# Patient Record
Sex: Female | Born: 1945 | Race: White | Hispanic: No | Marital: Married | Smoking: Former smoker
Health system: Southern US, Community
[De-identification: ages and names within clinical notes are randomized; demographics above are authoritative.]

## PROBLEM LIST (undated history)

## (undated) DIAGNOSIS — I10 Essential (primary) hypertension: Secondary | ICD-10-CM

## (undated) DIAGNOSIS — F419 Anxiety disorder, unspecified: Secondary | ICD-10-CM

## (undated) DIAGNOSIS — C801 Malignant (primary) neoplasm, unspecified: Secondary | ICD-10-CM

## (undated) DIAGNOSIS — G4730 Sleep apnea, unspecified: Secondary | ICD-10-CM

## (undated) DIAGNOSIS — I2699 Other pulmonary embolism without acute cor pulmonale: Secondary | ICD-10-CM

## (undated) DIAGNOSIS — E079 Disorder of thyroid, unspecified: Secondary | ICD-10-CM

## (undated) DIAGNOSIS — L404 Guttate psoriasis: Secondary | ICD-10-CM

## (undated) DIAGNOSIS — E039 Hypothyroidism, unspecified: Secondary | ICD-10-CM

## (undated) DIAGNOSIS — K219 Gastro-esophageal reflux disease without esophagitis: Secondary | ICD-10-CM

## (undated) DIAGNOSIS — M1990 Unspecified osteoarthritis, unspecified site: Secondary | ICD-10-CM

---
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
## 1997-09-03 HISTORY — PX: ABDOMINAL HYSTERECTOMY: SHX81

## 2006-06-17 ENCOUNTER — Other Ambulatory Visit: Payer: Self-pay

## 2006-06-17 ENCOUNTER — Emergency Department: Payer: Self-pay | Admitting: Emergency Medicine

## 2007-12-25 IMAGING — CR DG CHEST 1V PORT
1 series · 1 of 1 positions shown · non-contrast
Comparison: none

REASON FOR EXAM: cp
COMMENTS:

PROCEDURE:     DXR - DXR PORTABLE CHEST SINGLE VIEW  - June 18, 2006 [DATE]
RESULT:     AP view of the chest shows the lung fields to be clear. No
pneumonia, pneumothorax or pleural effusion is seen.  Heart size is normal.
The mediastinal and osseous structures show no significant abnormalities.

[view not recorded]
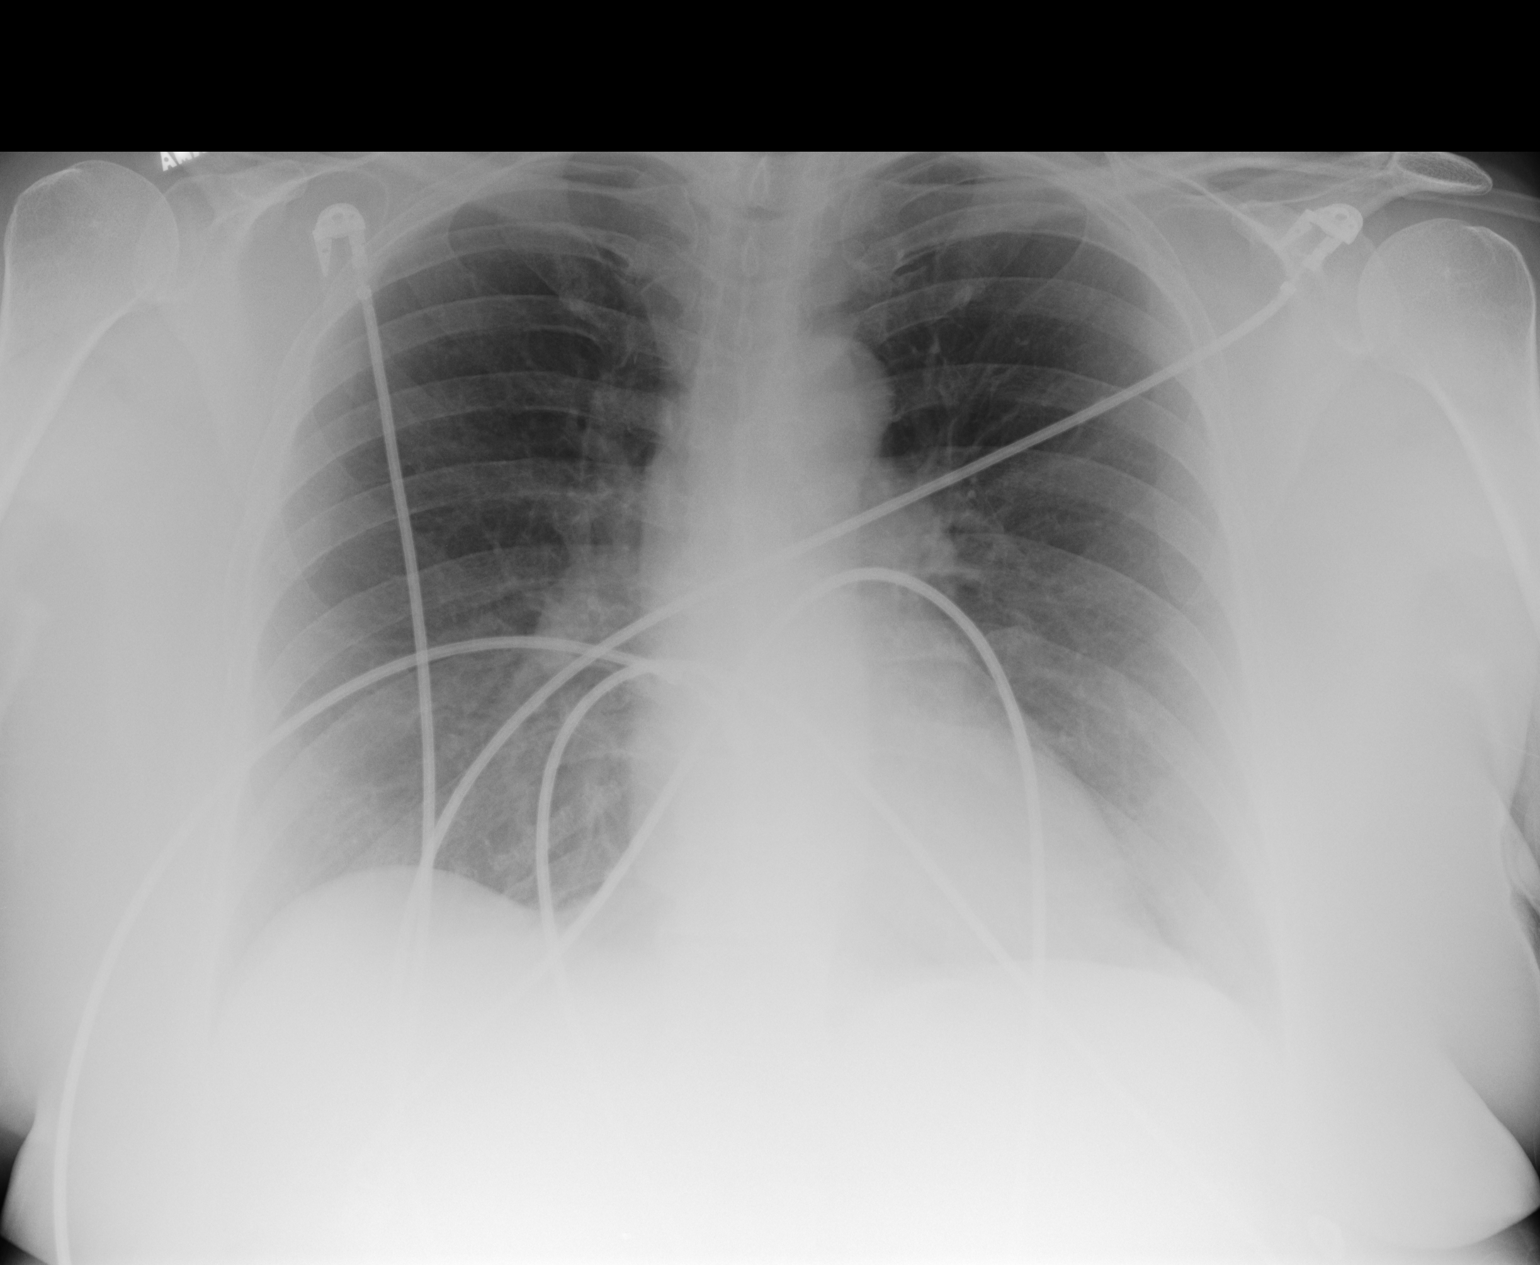

[1 of 1 positions shown; findings below may reference images not displayed]

IMPRESSION: 1)No acute changes are identified.

## 2008-10-05 ENCOUNTER — Ambulatory Visit: Payer: Self-pay

## 2011-09-04 HISTORY — PX: MOHS SURGERY: SUR867

## 2014-06-18 ENCOUNTER — Ambulatory Visit: Payer: Self-pay | Admitting: Internal Medicine

## 2014-09-03 DIAGNOSIS — I2699 Other pulmonary embolism without acute cor pulmonale: Secondary | ICD-10-CM

## 2014-09-03 DIAGNOSIS — E079 Disorder of thyroid, unspecified: Secondary | ICD-10-CM

## 2014-09-03 DIAGNOSIS — L404 Guttate psoriasis: Secondary | ICD-10-CM

## 2014-09-03 HISTORY — DX: Other pulmonary embolism without acute cor pulmonale: I26.99

## 2014-09-03 HISTORY — DX: Guttate psoriasis: L40.4

## 2014-09-03 HISTORY — DX: Disorder of thyroid, unspecified: E07.9

## 2016-05-11 ENCOUNTER — Other Ambulatory Visit: Payer: Self-pay | Admitting: Unknown Physician Specialty

## 2016-05-11 DIAGNOSIS — M1712 Unilateral primary osteoarthritis, left knee: Secondary | ICD-10-CM

## 2016-05-11 DIAGNOSIS — M25562 Pain in left knee: Secondary | ICD-10-CM

## 2016-05-22 ENCOUNTER — Ambulatory Visit: Admission: RE | Admit: 2016-05-22 | Payer: Medicare HMO | Source: Ambulatory Visit

## 2016-10-03 HISTORY — PX: JOINT REPLACEMENT: SHX530

## 2017-06-12 ENCOUNTER — Encounter
Admission: RE | Admit: 2017-06-12 | Discharge: 2017-06-12 | Disposition: A | Discharge status: Home / Self Care | Payer: Medicare HMO | Source: Ambulatory Visit | Attending: Orthopedic Surgery | Admitting: Orthopedic Surgery

## 2017-06-12 DIAGNOSIS — Z01810 Encounter for preprocedural cardiovascular examination: Secondary | ICD-10-CM | POA: Diagnosis present

## 2017-06-12 DIAGNOSIS — F419 Anxiety disorder, unspecified: Secondary | ICD-10-CM | POA: Insufficient documentation

## 2017-06-12 DIAGNOSIS — Z85828 Personal history of other malignant neoplasm of skin: Secondary | ICD-10-CM | POA: Diagnosis not present

## 2017-06-12 DIAGNOSIS — I10 Essential (primary) hypertension: Secondary | ICD-10-CM | POA: Insufficient documentation

## 2017-06-12 DIAGNOSIS — E119 Type 2 diabetes mellitus without complications: Secondary | ICD-10-CM | POA: Insufficient documentation

## 2017-06-12 DIAGNOSIS — Z01812 Encounter for preprocedural laboratory examination: Secondary | ICD-10-CM | POA: Diagnosis present

## 2017-06-12 DIAGNOSIS — E039 Hypothyroidism, unspecified: Secondary | ICD-10-CM | POA: Insufficient documentation

## 2017-06-12 DIAGNOSIS — K219 Gastro-esophageal reflux disease without esophagitis: Secondary | ICD-10-CM | POA: Diagnosis not present

## 2017-06-12 HISTORY — DX: Anxiety disorder, unspecified: F41.9

## 2017-06-12 HISTORY — DX: Other pulmonary embolism without acute cor pulmonale: I26.99

## 2017-06-12 HISTORY — DX: Malignant (primary) neoplasm, unspecified: C80.1

## 2017-06-12 HISTORY — DX: Essential (primary) hypertension: I10

## 2017-06-12 HISTORY — DX: Sleep apnea, unspecified: G47.30

## 2017-06-12 HISTORY — DX: Unspecified osteoarthritis, unspecified site: M19.90

## 2017-06-12 HISTORY — DX: Guttate psoriasis: L40.4

## 2017-06-12 HISTORY — DX: Gastro-esophageal reflux disease without esophagitis: K21.9

## 2017-06-12 HISTORY — DX: Disorder of thyroid, unspecified: E07.9

## 2017-06-12 HISTORY — DX: Hypothyroidism, unspecified: E03.9

## 2017-06-12 LAB — BASIC METABOLIC PANEL
Anion gap: 8 (ref 5–15)
BUN: 24 mg/dL — ABNORMAL HIGH (ref 6–20)
CHLORIDE: 106 mmol/L (ref 101–111)
CO2: 27 mmol/L (ref 22–32)
CREATININE: 0.91 mg/dL (ref 0.44–1.00)
Calcium: 9 mg/dL (ref 8.9–10.3)
Glucose, Bld: 164 mg/dL — ABNORMAL HIGH (ref 65–99)
POTASSIUM: 4.7 mmol/L (ref 3.5–5.1)
SODIUM: 141 mmol/L (ref 135–145)

## 2017-06-12 NOTE — Patient Instructions (Signed)
Your procedure is scheduled on: Wednesday Oct. 17, 2018. Report to Same Day Surgery. To find out your arrival time please call [phone] between 1PM - 3PM on Tuesday Oct. 16, 2018.  Remember: Instructions that are not followed completely may result in serious medical risk, up to and including death, or upon the discretion of your surgeon and anesthesiologist your surgery may need to be rescheduled.     _X__ 1. Do not eat food after midnight the night before your procedure.                 No gum chewing or hard candies. You may drink clear liquids up to 2 hours                 before you are scheduled to arrive for your surgery- DO not drink clear                 liquids within 2 hours of the start of your surgery.                 Clear Liquids include:  water.     _X__ 2.  No Alcohol for 24 hours before or after surgery.   ___ 3.  Do Not Smoke or use e-cigarettes For 24 Hours Prior to Your Surgery.                 Do not use any chewable tobacco products for at least 6 hours prior to                 surgery.  ____  4.  Bring all medications with you on the day of surgery if instructed.   _x___  5.  Notify your doctor if there is any change in your medical condition      (cold, fever, infections).     Do not wear jewelry, make-up, hairpins, clips or nail polish. Do not wear lotions, powders, or perfumes. You may wear deodorant. Do not shave 48 hours prior to surgery. Men may shave face and neck. Do not bring valuables to the hospital.    [facility] is not responsible for any belongings or valuables.  Contacts, dentures or bridgework may not be worn into surgery. Leave your suitcase in the car. After surgery it may be brought to your room. For patients admitted to the hospital, discharge time is determined by your treatment team.   Patients discharged the day of surgery will not be allowed to drive home.   Please read over the following fact sheets  that you were given:   Preparing for Surgery   __x__ Take these medicines the morning of surgery with A SIP OF WATER:    1. omeprazole (PRILOSEC) take an extra dose at bedtime the night prior to surgery and morning of surgery   ____ Fleet Enema (as directed)   _x___ Use CHG Soap as directed  _x___ Use inhalers on the day of surgery  _x___ Stop metformin 2 days prior to surgery, last dose on Sunday.    ____ Take 1/2 of usual insulin dose the night before surgery. No insulin the morning          of surgery.   ____ Stop Coumadin/Plavix/aspirin on does not apply.  __x__ Stop Anti-inflammatories: ibuprofen (ADVIL,MOTRIN), naproxen (NAPROSYN) ,  now, can take Tylenol   ____ Stop supplements:co-enzyme Q-10 until after surgery.    ____ Bring C-Pap to the hospital.

## 2017-06-12 NOTE — Pre-Procedure Instructions (Signed)
ECG 12 Lead1/23/2018 [facility] Name Value Ref Range  EKG Systolic BP  mmHg  EKG Diastolic BP  mmHg  EKG Ventricular Rate 92 BPM  EKG Atrial Rate 92 BPM  EKG P-R Interval 168 ms  EKG QRS Duration 88 ms  EKG Q-T Interval 370 ms  EKG QTC Calculation 457 ms  EKG Calculated P Axis 42 degrees  EKG Calculated R Axis 7 degrees  EKG Calculated T Axis 43 degrees  Result Narrative  NORMAL SINUS RHYTHM NORMAL ECG WHEN COMPARED WITH ECG OF 11-Dec-2015 07:45, MINIMAL CRITERIA FOR ANTERIOR INFARCTARE NO LONGER PRESENT T WAVE INVERSION NO LONGER EVIDENT IN ANTERIOR LEADS Confirmed by SMITHMD, SIDNEY ([id number]) on 09/25/2016 9:55:20 PM

## 2017-06-18 MED ORDER — CEFAZOLIN SODIUM-DEXTROSE 2-4 GM/100ML-% IV SOLN
2.0000 g | INTRAVENOUS | Status: AC
  Administered 2017-06-19: 2 g via INTRAVENOUS

## 2017-06-19 ENCOUNTER — Encounter: Payer: Self-pay | Admitting: Orthopedic Surgery

## 2017-06-19 ENCOUNTER — Ambulatory Visit: Payer: Medicare HMO | Admitting: Anesthesiology

## 2017-06-19 ENCOUNTER — Ambulatory Visit
Admission: RE | Admit: 2017-06-19 | Discharge: 2017-06-19 | Disposition: A | Discharge status: Home / Self Care | Payer: Medicare HMO | Source: Ambulatory Visit | Attending: Orthopedic Surgery | Admitting: Orthopedic Surgery

## 2017-06-19 ENCOUNTER — Encounter
Admission: RE | Disposition: A | Discharge status: Home / Self Care | Payer: Self-pay | Source: Ambulatory Visit | Attending: Orthopedic Surgery

## 2017-06-19 DIAGNOSIS — M24662 Ankylosis, left knee: Secondary | ICD-10-CM | POA: Insufficient documentation

## 2017-06-19 DIAGNOSIS — I10 Essential (primary) hypertension: Secondary | ICD-10-CM | POA: Insufficient documentation

## 2017-06-19 DIAGNOSIS — R42 Dizziness and giddiness: Secondary | ICD-10-CM | POA: Insufficient documentation

## 2017-06-19 DIAGNOSIS — Z79899 Other long term (current) drug therapy: Secondary | ICD-10-CM | POA: Diagnosis not present

## 2017-06-19 DIAGNOSIS — Z807 Family history of other malignant neoplasms of lymphoid, hematopoietic and related tissues: Secondary | ICD-10-CM | POA: Insufficient documentation

## 2017-06-19 DIAGNOSIS — Z833 Family history of diabetes mellitus: Secondary | ICD-10-CM | POA: Insufficient documentation

## 2017-06-19 DIAGNOSIS — Z853 Personal history of malignant neoplasm of breast: Secondary | ICD-10-CM | POA: Diagnosis not present

## 2017-06-19 DIAGNOSIS — Z87891 Personal history of nicotine dependence: Secondary | ICD-10-CM | POA: Diagnosis not present

## 2017-06-19 DIAGNOSIS — E785 Hyperlipidemia, unspecified: Secondary | ICD-10-CM | POA: Insufficient documentation

## 2017-06-19 DIAGNOSIS — Z85828 Personal history of other malignant neoplasm of skin: Secondary | ICD-10-CM | POA: Diagnosis not present

## 2017-06-19 DIAGNOSIS — Z96652 Presence of left artificial knee joint: Secondary | ICD-10-CM | POA: Insufficient documentation

## 2017-06-19 DIAGNOSIS — E119 Type 2 diabetes mellitus without complications: Secondary | ICD-10-CM | POA: Diagnosis not present

## 2017-06-19 DIAGNOSIS — M1990 Unspecified osteoarthritis, unspecified site: Secondary | ICD-10-CM | POA: Insufficient documentation

## 2017-06-19 DIAGNOSIS — K219 Gastro-esophageal reflux disease without esophagitis: Secondary | ICD-10-CM | POA: Diagnosis not present

## 2017-06-19 DIAGNOSIS — Z820 Family history of epilepsy and other diseases of the nervous system: Secondary | ICD-10-CM | POA: Insufficient documentation

## 2017-06-19 DIAGNOSIS — Z90710 Acquired absence of both cervix and uterus: Secondary | ICD-10-CM | POA: Diagnosis not present

## 2017-06-19 DIAGNOSIS — Z7984 Long term (current) use of oral hypoglycemic drugs: Secondary | ICD-10-CM | POA: Insufficient documentation

## 2017-06-19 DIAGNOSIS — Z86711 Personal history of pulmonary embolism: Secondary | ICD-10-CM | POA: Insufficient documentation

## 2017-06-19 DIAGNOSIS — E049 Nontoxic goiter, unspecified: Secondary | ICD-10-CM | POA: Diagnosis not present

## 2017-06-19 DIAGNOSIS — L404 Guttate psoriasis: Secondary | ICD-10-CM | POA: Insufficient documentation

## 2017-06-19 DIAGNOSIS — G4730 Sleep apnea, unspecified: Secondary | ICD-10-CM | POA: Diagnosis not present

## 2017-06-19 DIAGNOSIS — F329 Major depressive disorder, single episode, unspecified: Secondary | ICD-10-CM | POA: Diagnosis not present

## 2017-06-19 DIAGNOSIS — Z8249 Family history of ischemic heart disease and other diseases of the circulatory system: Secondary | ICD-10-CM | POA: Insufficient documentation

## 2017-06-19 HISTORY — PX: KNEE ARTHROSCOPY: SHX127

## 2017-06-19 LAB — GLUCOSE, CAPILLARY
GLUCOSE-CAPILLARY: 109 mg/dL — AB (ref 65–99)
GLUCOSE-CAPILLARY: 128 mg/dL — AB (ref 65–99)

## 2017-06-19 SURGERY — ARTHROSCOPY, KNEE
Anesthesia: General | Site: Knee | Laterality: Left | Wound class: Clean

## 2017-06-19 MED ORDER — MIDAZOLAM HCL 2 MG/2ML IJ SOLN
INTRAMUSCULAR | Status: AC
  Filled 2017-06-19: qty 2

## 2017-06-19 MED ORDER — LIDOCAINE HCL (PF) 2 % IJ SOLN
INTRAMUSCULAR | Status: AC
  Filled 2017-06-19: qty 10

## 2017-06-19 MED ORDER — BUPIVACAINE-EPINEPHRINE (PF) 0.25% -1:200000 IJ SOLN
INTRAMUSCULAR | Status: DC | PRN
  Administered 2017-06-19: 5 mL via PERINEURAL
  Administered 2017-06-19: 25 mL via PERINEURAL

## 2017-06-19 MED ORDER — MIDAZOLAM HCL 2 MG/2ML IJ SOLN
INTRAMUSCULAR | Status: DC | PRN
  Administered 2017-06-19: 2 mg via INTRAVENOUS

## 2017-06-19 MED ORDER — OXYCODONE HCL 5 MG PO TABS
5.0000 mg | ORAL_TABLET | Freq: Once | ORAL | Status: AC | PRN
  Administered 2017-06-19: 5 mg via ORAL

## 2017-06-19 MED ORDER — CEFAZOLIN SODIUM-DEXTROSE 2-4 GM/100ML-% IV SOLN
INTRAVENOUS | Status: AC
  Filled 2017-06-19: qty 100

## 2017-06-19 MED ORDER — ONDANSETRON HCL 4 MG/2ML IJ SOLN
INTRAMUSCULAR | Status: DC | PRN
Start: 2017-06-19 — End: 2017-06-19
  Administered 2017-06-19: 4 mg via INTRAVENOUS

## 2017-06-19 MED ORDER — SEVOFLURANE IN SOLN
RESPIRATORY_TRACT | Status: AC
  Filled 2017-06-19: qty 250

## 2017-06-19 MED ORDER — FENTANYL CITRATE (PF) 100 MCG/2ML IJ SOLN
25.0000 ug | INTRAMUSCULAR | Status: DC | PRN
  Administered 2017-06-19: 50 ug via INTRAVENOUS
  Administered 2017-06-19 (×3): 25 ug via INTRAVENOUS

## 2017-06-19 MED ORDER — FENTANYL CITRATE (PF) 100 MCG/2ML IJ SOLN
INTRAMUSCULAR | Status: DC | PRN
  Administered 2017-06-19 (×2): 50 ug via INTRAVENOUS
  Administered 2017-06-19: 25 ug via INTRAVENOUS
  Administered 2017-06-19: 50 ug via INTRAVENOUS
  Administered 2017-06-19: 25 ug via INTRAVENOUS

## 2017-06-19 MED ORDER — SODIUM CHLORIDE 0.9 % IV SOLN: INTRAVENOUS | Status: DC

## 2017-06-19 MED ORDER — PROPOFOL 10 MG/ML IV BOLUS
INTRAVENOUS | Status: AC
  Filled 2017-06-19: qty 20

## 2017-06-19 MED ORDER — METOCLOPRAMIDE HCL 10 MG PO TABS: 5.0000 mg | ORAL_TABLET | Freq: Three times a day (TID) | ORAL | Status: DC | PRN

## 2017-06-19 MED ORDER — GLYCOPYRROLATE 0.2 MG/ML IJ SOLN
INTRAMUSCULAR | Status: AC
  Filled 2017-06-19: qty 1

## 2017-06-19 MED ORDER — PROPOFOL 10 MG/ML IV BOLUS
INTRAVENOUS | Status: DC | PRN
  Administered 2017-06-19: 180 mg via INTRAVENOUS

## 2017-06-19 MED ORDER — CHLORHEXIDINE GLUCONATE 4 % EX LIQD: 60.0000 mL | Freq: Once | CUTANEOUS | Status: DC

## 2017-06-19 MED ORDER — LIDOCAINE HCL (CARDIAC) 20 MG/ML IV SOLN
INTRAVENOUS | Status: DC | PRN
  Administered 2017-06-19: 100 mg via INTRAVENOUS

## 2017-06-19 MED ORDER — ONDANSETRON HCL 4 MG/2ML IJ SOLN
4.0000 mg | Freq: Four times a day (QID) | INTRAMUSCULAR | Status: DC | PRN
  Administered 2017-06-19: 4 mg via INTRAVENOUS

## 2017-06-19 MED ORDER — OXYCODONE HCL 5 MG PO TABS: 5.0000 mg | ORAL_TABLET | ORAL | Status: DC | PRN

## 2017-06-19 MED ORDER — OXYCODONE HCL 5 MG/5ML PO SOLN: 5.0000 mg | Freq: Once | ORAL | Status: AC | PRN

## 2017-06-19 MED ORDER — SUCCINYLCHOLINE CHLORIDE 200 MG/10ML IV SOSY
PREFILLED_SYRINGE | INTRAVENOUS | Status: DC | PRN
  Administered 2017-06-19: 20 mg via INTRAVENOUS

## 2017-06-19 MED ORDER — ONDANSETRON HCL 4 MG/2ML IJ SOLN
INTRAMUSCULAR | Status: AC
  Filled 2017-06-19: qty 2

## 2017-06-19 MED ORDER — GLYCOPYRROLATE 0.2 MG/ML IJ SOLN
INTRAMUSCULAR | Status: DC | PRN
  Administered 2017-06-19: 0.2 mg via INTRAVENOUS

## 2017-06-19 MED ORDER — BUPIVACAINE-EPINEPHRINE (PF) 0.25% -1:200000 IJ SOLN
INTRAMUSCULAR | Status: AC
  Filled 2017-06-19: qty 30

## 2017-06-19 MED ORDER — ACETAMINOPHEN 10 MG/ML IV SOLN
INTRAVENOUS | Status: AC
  Filled 2017-06-19: qty 100

## 2017-06-19 MED ORDER — METOCLOPRAMIDE HCL 5 MG/ML IJ SOLN: 5.0000 mg | Freq: Three times a day (TID) | INTRAMUSCULAR | Status: DC | PRN

## 2017-06-19 MED ORDER — FENTANYL CITRATE (PF) 100 MCG/2ML IJ SOLN
INTRAMUSCULAR | Status: AC
  Filled 2017-06-19: qty 2

## 2017-06-19 MED ORDER — MORPHINE SULFATE 4 MG/ML IJ SOLN
INTRAMUSCULAR | Status: DC | PRN
  Administered 2017-06-19: 4 mg

## 2017-06-19 MED ORDER — APIXABAN 2.5 MG PO TABS: 2.5000 mg | ORAL_TABLET | Freq: Two times a day (BID) | ORAL | Status: AC

## 2017-06-19 MED ORDER — FENTANYL CITRATE (PF) 100 MCG/2ML IJ SOLN
INTRAMUSCULAR | Status: AC
  Administered 2017-06-19: 25 ug via INTRAVENOUS
  Filled 2017-06-19: qty 2

## 2017-06-19 MED ORDER — SODIUM CHLORIDE 0.9 % IV SOLN
INTRAVENOUS | Status: DC
  Administered 2017-06-19: 14:00:00 via INTRAVENOUS

## 2017-06-19 MED ORDER — OXYCODONE HCL 5 MG PO TABS: 5.0000 mg | ORAL_TABLET | ORAL | 0 refills | Status: DC | PRN

## 2017-06-19 MED ORDER — SODIUM CHLORIDE 0.9 % IJ SOLN
INTRAMUSCULAR | Status: AC
  Filled 2017-06-19: qty 10

## 2017-06-19 MED ORDER — ONDANSETRON HCL 4 MG PO TABS
4.0000 mg | ORAL_TABLET | Freq: Four times a day (QID) | ORAL | Status: DC | PRN
Start: 2017-06-19 — End: 2017-06-19

## 2017-06-19 MED ORDER — LACTATED RINGERS IV SOLN
INTRAVENOUS | Status: DC | PRN
  Administered 2017-06-19: 17:00:00 via INTRAVENOUS

## 2017-06-19 MED ORDER — DEXAMETHASONE SODIUM PHOSPHATE 4 MG/ML IJ SOLN
INTRAMUSCULAR | Status: DC | PRN
  Administered 2017-06-19: 10 mg via INTRAVENOUS

## 2017-06-19 MED ORDER — ACETAMINOPHEN 10 MG/ML IV SOLN
INTRAVENOUS | Status: DC | PRN
  Administered 2017-06-19: 1000 mg via INTRAVENOUS

## 2017-06-19 MED ORDER — OXYCODONE HCL 5 MG PO TABS
ORAL_TABLET | ORAL | Status: AC
  Administered 2017-06-19: 5 mg via ORAL
  Filled 2017-06-19: qty 1

## 2017-06-19 MED ORDER — MIDAZOLAM HCL 2 MG/2ML IJ SOLN
INTRAMUSCULAR | Status: AC
  Administered 2017-06-19: 1 mg via INTRAVENOUS
  Filled 2017-06-19: qty 2

## 2017-06-19 MED ORDER — MORPHINE SULFATE (PF) 4 MG/ML IV SOLN
INTRAVENOUS | Status: AC
  Filled 2017-06-19: qty 1

## 2017-06-19 MED ORDER — MIDAZOLAM HCL 2 MG/2ML IJ SOLN
1.0000 mg | Freq: Once | INTRAMUSCULAR | Status: AC
  Administered 2017-06-19: 1 mg via INTRAVENOUS

## 2017-06-19 MED ORDER — DEXAMETHASONE SODIUM PHOSPHATE 10 MG/ML IJ SOLN
INTRAMUSCULAR | Status: AC
  Filled 2017-06-19: qty 1

## 2017-06-19 SURGICAL SUPPLY — 33 items
ADAPTER IRRIG TUBE 2 SPIKE SOL (ADAPTER) ×4 IMPLANT
BLADE SHAVER 4.5 DBL SERAT CV (CUTTER) IMPLANT
COOLER POLAR GLACIER W/PUMP (MISCELLANEOUS) ×2 IMPLANT
CUFF TOURN 24 STER (MISCELLANEOUS) IMPLANT
CUFF TOURN 30 STER DUAL PORT (MISCELLANEOUS) ×2 IMPLANT
DRSG DERMACEA 8X12 NADH (GAUZE/BANDAGES/DRESSINGS) ×2 IMPLANT
DURAPREP 26ML APPLICATOR (WOUND CARE) ×4 IMPLANT
GAUZE SPONGE 4X4 12PLY STRL (GAUZE/BANDAGES/DRESSINGS) ×2 IMPLANT
GLOVE BIOGEL M STRL SZ7.5 (GLOVE) ×4 IMPLANT
GLOVE BIOGEL PI IND STRL 7.0 (GLOVE) ×2 IMPLANT
GLOVE BIOGEL PI INDICATOR 7.0 (GLOVE) ×2
GLOVE INDICATOR 8.0 STRL GRN (GLOVE) ×2 IMPLANT
GOWN STRL REUS W/ TWL LRG LVL3 (GOWN DISPOSABLE) ×2 IMPLANT
GOWN STRL REUS W/TWL LRG LVL3 (GOWN DISPOSABLE) ×2
IV LACTATED RINGER IRRG 3000ML (IV SOLUTION) ×12
IV LR IRRIG 3000ML ARTHROMATIC (IV SOLUTION) ×12 IMPLANT
KIT RM TURNOVER STRD PROC AR (KITS) ×2 IMPLANT
MANIFOLD NEPTUNE II (INSTRUMENTS) ×2 IMPLANT
PACK ARTHROSCOPY KNEE (MISCELLANEOUS) ×2 IMPLANT
PAD ABD DERMACEA PRESS 5X9 (GAUZE/BANDAGES/DRESSINGS) ×4 IMPLANT
PAD WRAPON POLOR MULTI XL (MISCELLANEOUS) ×1 IMPLANT
SET TUBE SUCT SHAVER OUTFL 24K (TUBING) ×2 IMPLANT
SET TUBE TIP INTRA-ARTICULAR (MISCELLANEOUS) ×2 IMPLANT
SOL PREP PVP 2OZ (MISCELLANEOUS) ×2
SOLUTION PREP PVP 2OZ (MISCELLANEOUS) ×1 IMPLANT
SUT ETHILON 3-0 FS-10 30 BLK (SUTURE) ×2
SUTURE EHLN 3-0 FS-10 30 BLK (SUTURE) ×1 IMPLANT
SYR 5ML 18GX1 1/2 (NEEDLE) ×2 IMPLANT
TUBING ARTHRO INFLOW-ONLY STRL (TUBING) ×2 IMPLANT
WAND HAND CNTRL MULTIVAC 50 (MISCELLANEOUS) ×2 IMPLANT
WRAP KNEE W/COLD PACKS 25.5X14 (SOFTGOODS) IMPLANT
WRAP-ON POLOR PAD MULTI XL (MISCELLANEOUS) ×1
WRAPON POLOR PAD MULTI XL (MISCELLANEOUS) ×1

## 2017-06-19 NOTE — Anesthesia Procedure Notes (Signed)
Procedure Name: LMA Insertion Date/Time: 06/19/2017 3:47 PM Performed by: Rosaria Ferries, Lilyth Lawyer Pre-anesthesia Checklist: Patient identified, Emergency Drugs available, Suction available and Patient being monitored Patient Re-evaluated:Patient Re-evaluated prior to induction Oxygen Delivery Method: Circle system utilized Preoxygenation: Pre-oxygenation with 100% oxygen Induction Type: IV induction LMA Size: 4.0 Number of attempts: 1 Placement Confirmation: positive ETCO2 Tube secured with: Tape Dental Injury: Teeth and Oropharynx as per pre-operative assessment

## 2017-06-19 NOTE — H&P (Signed)
The patient has been re-examined, and the chart reviewed, and there have been no interval changes to the documented history and physical.    The risks, benefits, and alternatives have been discussed at length. The patient expressed understanding of the risks benefits and agreed with plans for surgical intervention.  James P. Hooten, Jr. M.D.    

## 2017-06-19 NOTE — Anesthesia Postprocedure Evaluation (Signed)
Anesthesia Post Note  Patient: Leslie Bass  Procedure(s) Performed: ARTHROSCOPY KNEE, EXTENSIVE SYNOVECTOMY, AND LYSIS OF ADHESIONS (Left Knee)  Patient location during evaluation: PACU Anesthesia Type: General Level of consciousness: awake and alert Pain management: pain level controlled Vital Signs Assessment: post-procedure vital signs reviewed and stable Respiratory status: spontaneous breathing, nonlabored ventilation, respiratory function stable and patient connected to nasal cannula oxygen Cardiovascular status: blood pressure returned to baseline and stable Postop Assessment: no apparent nausea or vomiting Anesthetic complications: no     Last Vitals:  Vitals:   06/19/17 1913 06/19/17 1921  BP:  (!) 137/59  Pulse:  90  Resp:  12  Temp:  (!) 36.4 C  SpO2: 91% 92%    Last Pain:  Vitals:   06/19/17 1921  TempSrc: Temporal  PainSc: 4                  Camaryn Clan

## 2017-06-19 NOTE — Transfer of Care (Signed)
Immediate Anesthesia Transfer of Care Note  Patient: Leslie Bass  Procedure(s) Performed: ARTHROSCOPY KNEE, EXTENSIVE SYNOVECTOMY, AND LYSIS OF ADHESIONS (Left Knee)  Patient Location: PACU  Anesthesia Type:General  Level of Consciousness: sedated  Airway & Oxygen Therapy: Patient Spontanous Breathing and Patient connected to face mask oxygen  Post-op Assessment: Report given to RN and Post -op Vital signs reviewed and stable  Post vital signs: Reviewed and stable  Last Vitals:  Vitals:   06/19/17 1353  BP: (!) 157/94  Pulse: (!) 110  Resp: 20  Temp: 36.8 C  SpO2: 100%    Last Pain:  Vitals:   06/19/17 1353  TempSrc: Oral  PainSc: 5       Patients Stated Pain Goal: 2 (63/81/77 1165)  Complications: No apparent anesthesia complications

## 2017-06-19 NOTE — Anesthesia Preprocedure Evaluation (Addendum)
Anesthesia Evaluation  Patient identified by MRN, date of birth, ID band Patient awake    Reviewed: Allergy & Precautions, H&P , NPO status , Patient's Chart, lab work & pertinent test results  History of Anesthesia Complications Negative for: history of anesthetic complications  Airway Mallampati: III  TM Distance: <3 FB Neck ROM: full    Dental  (+) Chipped   Pulmonary neg shortness of breath, sleep apnea and Continuous Positive Airway Pressure Ventilation , former smoker,           Cardiovascular Exercise Tolerance: Good hypertension, (-) angina(-) Past MI and (-) DOE      Neuro/Psych PSYCHIATRIC DISORDERS Anxiety negative neurological ROS     GI/Hepatic Neg liver ROS, GERD  Medicated and Controlled,  Endo/Other  diabetes, Well Controlled, Type 2Hypothyroidism   Renal/GU      Musculoskeletal  (+) Arthritis ,   Abdominal   Peds  Hematology negative hematology ROS (+)   Anesthesia Other Findings Past Medical History: No date: Anxiety No date: Arthritis No date: Cancer [facility])     Comment:  skin No date: GERD (gastroesophageal reflux disease) No date: Hypertension No date: Hypothyroidism 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, guttate 2016: Pulmonary embolism ([place])     Comment:  bilateral No date: Sleep apnea 2016: Thyroid mass     Comment:  non malignant  Past Surgical History: 1999: ABDOMINAL HYSTERECTOMY 1977: CESAREAN SECTION 10/03/2016: JOINT REPLACEMENT; Left 2013: MOHS SURGERY; Right     Comment:  leg and upper lip 1961: TONSILLECTOMY  BMI    Body Mass Index:  35.43 kg/m      Reproductive/Obstetrics negative OB ROS                            Anesthesia Physical Anesthesia Plan  ASA: III  Anesthesia Plan: General   Post-op Pain Management:    Induction: Intravenous  PONV Risk Score and Plan: 3 and Ondansetron, Dexamethasone, Midazolam and Treatment may vary due to age or medical  condition  Airway Management Planned: LMA  Additional Equipment:   Intra-op Plan:   Post-operative Plan: Extubation in OR  Informed Consent: I have reviewed the patients History and Physical, chart, labs and discussed the procedure including the risks, benefits and alternatives for the proposed anesthesia with the patient or authorized representative who has indicated his/her understanding and acceptance.   Dental Advisory Given  Plan Discussed with: Anesthesiologist, CRNA and Surgeon  Anesthesia Plan Comments: (Dr. Marry Guan reports that he will not need paralysis for this case  Patient consented for risks of anesthesia including but not limited to:  - adverse reactions to medications - damage to teeth, lips or other oral mucosa - sore throat or hoarseness - Damage to heart, brain, lungs or loss of life  Patient voiced understanding.)       Anesthesia Quick Evaluation

## 2017-06-19 NOTE — Op Note (Signed)
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   ASSISTANT: none  ANESTHESIA: general  ESTIMATED BLOOD LOSS: Minimal  FLUIDS REPLACED: 1200 mL of crystalloid  TOURNIQUET TIME: Not used   DRAINS: none  INDICATIONS FOR SURGERY: Leslie Bass is a 71 y.o. year old female who previously underwent left total knee arthroplasty in [place]. Despite aggressive physical therapy and home exercise program, the patient had limited range of motion and significant pain. Findings were consistent with arthrofibrosis. After discussion of the risks and benefits of surgical intervention, the patient expressed understanding of the risks benefits and agree with plans for left knee arthroscopy.   PROCEDURE IN DETAIL: The patient was brought into the operating room and, after adequate general anesthesia was achieved, a tourniquet was applied to the left thigh and the leg was placed in the leg holder. All bony prominences were well padded. Preoperative flexion was measured to be 90. The patient's left knee was cleaned and prepped with alcohol and Duraprep and draped in the usual sterile fashion. A "timeout" was performed as per usual protocol. The anticipated portal sites were injected with 0.25% Marcaine with epinephrine. An anterolateral incision was made and a cannula was inserted. A small effusion was evacuated and the knee was distended with fluid using the pump. The scope was advanced down the medial gutter into the medial compartment. Under visualization with the scope, an anteromedial portal was created and a hooked probe was inserted. Abundant fibrotic tissue was encountered anterior to the implants  and within the notch. A 4.5 mm incisor shaver was inserted and the fibrotic tissue was debrided. Additional debridement and hemostasis was achieved using the 50 ArthroCare wand. Continued lysis of the adhesions was performed using a combination of the incisor shaver and the ArthroCare wand along the medial gutter, removing extensive fibrotic bands. Next, debridement of the fibrotic tissue was performed from around the patellar component. The patellar component was visualized and noted be in good condition. The fibrotic tissue and hypertrophic synovium was debrided from the boundaries of the patellar component. Next, the scope was removed from the anterolateral portal and reinserted via the anteromedial portal. Fibrotic tissue and bands were debrided along the anterolateral aspect of the implant and continued along the lateral gutter. Inspection of the suprapatellar pouch demonstrated thickened fibrotic tissue. This was debrided using combination of the 4.5 mm incisor shaver and the 50th the ArthroCare wand. After extensive debridement of the adhesions, the knee was placed range of motion with good patellar tracking appreciated and improved flexion noted.  The knee was irrigated with copius amounts of fluid and suctioned dry. The anterolateral portal was re-approximated with #3-0 nylon. A combination of 0.25% Marcaine with epinephrine and 4 mg of Morphine were injected via the scope. The scope was removed and the anteromedial portal was re-approximated with #3-0 nylon. A sterile dressing was applied. Knee flexion was measured following the procedure and was noted to be 114 with the dressing in place.  The patient tolerated the procedure well and was transported to the PACU in stable condition.  Leslie Micallef P. Holley Bouche., M.D.

## 2017-06-19 NOTE — Discharge Instructions (Signed)
AMBULATORY SURGERY  DISCHARGE INSTRUCTIONS   1) The drugs that you were given will stay in your system until tomorrow so for the next 24 hours you should not:  A) Drive an automobile B) Make any legal decisions C) Drink any alcoholic beverage   2) You may resume regular meals tomorrow.  Today it is better to start with liquids and gradually work up to solid foods.  You may eat anything you prefer, but it is better to start with liquids, then soup and crackers, and gradually work up to solid foods.   3) Please notify your doctor immediately if you have any unusual bleeding, trouble breathing, redness and pain at the surgery site, drainage, fever, or pain not relieved by medication. 4)   5) Your post-operative visit with Dr.                                     is: Date:                        Time:    Please call to schedule your post-operative visit.  6) Additional Instructions:      Instructions after Knee Arthroscopy    James P. Holley Bouche., M.D.     Dept. of [facility]  [place], [place]  [address]   Phone: [phone]   Fax: [phone]   DIET:  Drink plenty of non-alcoholic fluids & begin a light diet.  Resume your normal diet the day after surgery.  ACTIVITY:   You may use crutches or a walker with weight-bearing as tolerated, unless instructed otherwise.  You may wean yourself off of the walker or crutches as tolerated.   Begin doing gentle exercises. Exercising will reduce the pain and swelling, increase motion, and prevent muscle weakness.    Avoid strenuous activities or athletics for a minimum of 4-6 weeks after arthroscopic surgery.  Do not drive or operate any equipment until instructed.  WOUND CARE:   Place one to two pillows under the knee the first day or two when sitting or lying.   Continue to use the PolarCare to reduce pain and swelling.  The small incisions in your knee are  closed with nylon stitches. The stitches will be removed in the office.  The bulky dressing may be removed on the second day after surgery. DO NOT TOUCH THE STITCHES. Put a Band-Aid over each stitch. Do NOT use any ointments or creams on the incisions.   You may bathe or shower after the stitches are removed at the first office visit following surgery.  MEDICATIONS:  You may resume your regular medications.  Please take the pain medication as prescribed.  Do not take pain medication on an empty stomach.  Do not drive or drink alcoholic beverages when taking pain medications.  CALL THE OFFICE FOR:  Temperature above 101 degrees  Excessive bleeding or drainage on the dressing.  Excessive swelling, coldness, or paleness of the toes.  Persistent nausea and vomiting.  FOLLOW-UP:   You should have an appointment to return to the office in 7-10 days after surgery.    AMBULATORY SURGERY  DISCHARGE INSTRUCTIONS   7) The drugs that you were given will stay in your system until tomorrow so for the next 24 hours you should not:  D) Drive an automobile E) Make any legal  decisions F) Drink any alcoholic beverage   8) You may resume regular meals tomorrow.  Today it is better to start with liquids and gradually work up to solid foods.  You may eat anything you prefer, but it is better to start with liquids, then soup and crackers, and gradually work up to solid foods.   9) Please notify your doctor immediately if you have any unusual bleeding, trouble breathing, redness and pain at the surgery site, drainage, fever, or pain not relieved by medication.    10) Additional Instructions:        Please contact your physician with any problems or Same Day Surgery at [phone], Monday through Friday 6 am to 4 pm, or [place] at [facility] number at [phone].

## 2017-06-19 NOTE — Anesthesia Post-op Follow-up Note (Signed)
Anesthesia QCDR form completed.        

## 2017-06-20 ENCOUNTER — Encounter: Payer: Self-pay | Admitting: Orthopedic Surgery

## 2017-10-24 ENCOUNTER — Ambulatory Visit: Payer: Medicare HMO | Attending: Nurse Practitioner | Admitting: Nurse Practitioner

## 2017-10-24 ENCOUNTER — Encounter: Payer: Self-pay | Admitting: Nurse Practitioner

## 2017-10-24 ENCOUNTER — Other Ambulatory Visit: Payer: Self-pay

## 2017-10-24 VITALS — BP 155/78 | HR 114 | Temp 98.9°F | Resp 18 | Ht 63.0 in | Wt 200.0 lb

## 2017-10-24 DIAGNOSIS — Z90710 Acquired absence of both cervix and uterus: Secondary | ICD-10-CM | POA: Insufficient documentation

## 2017-10-24 DIAGNOSIS — M79605 Pain in left leg: Secondary | ICD-10-CM

## 2017-10-24 DIAGNOSIS — Z96652 Presence of left artificial knee joint: Secondary | ICD-10-CM | POA: Insufficient documentation

## 2017-10-24 DIAGNOSIS — G8929 Other chronic pain: Secondary | ICD-10-CM | POA: Insufficient documentation

## 2017-10-24 DIAGNOSIS — Z87891 Personal history of nicotine dependence: Secondary | ICD-10-CM | POA: Insufficient documentation

## 2017-10-24 DIAGNOSIS — Z86711 Personal history of pulmonary embolism: Secondary | ICD-10-CM | POA: Insufficient documentation

## 2017-10-24 DIAGNOSIS — I10 Essential (primary) hypertension: Secondary | ICD-10-CM | POA: Diagnosis not present

## 2017-10-24 DIAGNOSIS — M25562 Pain in left knee: Secondary | ICD-10-CM | POA: Diagnosis not present

## 2017-10-24 DIAGNOSIS — F419 Anxiety disorder, unspecified: Secondary | ICD-10-CM | POA: Insufficient documentation

## 2017-10-24 DIAGNOSIS — Z789 Other specified health status: Secondary | ICD-10-CM | POA: Diagnosis not present

## 2017-10-24 DIAGNOSIS — Z9889 Other specified postprocedural states: Secondary | ICD-10-CM | POA: Insufficient documentation

## 2017-10-24 DIAGNOSIS — M899 Disorder of bone, unspecified: Secondary | ICD-10-CM | POA: Diagnosis not present

## 2017-10-24 DIAGNOSIS — G894 Chronic pain syndrome: Secondary | ICD-10-CM | POA: Insufficient documentation

## 2017-10-24 DIAGNOSIS — K219 Gastro-esophageal reflux disease without esophagitis: Secondary | ICD-10-CM | POA: Diagnosis not present

## 2017-10-24 DIAGNOSIS — Z79899 Other long term (current) drug therapy: Secondary | ICD-10-CM | POA: Diagnosis not present

## 2017-10-24 NOTE — Progress Notes (Signed)
Safety precautions to be maintained throughout the outpatient stay will include: orient to surroundings, keep bed in low position, maintain call bell within reach at all times, provide assistance with transfer out of bed and ambulation.  

## 2017-10-24 NOTE — Progress Notes (Signed)
Patient's Name: Leslie Bass  MRN: [id number]  Referring Provider: Dereck Leep, MD  DOB: 01/01/1946  PCP: [facility], [place] Not In  DOS: 10/24/2017  Note by: Dionisio David NP  Service setting: Ambulatory outpatient  Specialty: Interventional Pain Management  Location: [facility] (AMB) Pain Management Facility    Patient type: New Patient    Primary Reason(s) for Visit: Initial Patient Evaluation CC: Knee Pain (left)  HPI  Leslie Bass is a 72 y.o. year old, female patient, who comes today for an initial evaluation. 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 Her primarily concern today is the Knee Pain (left)  Pain Assessment: Location: Left Knee Radiating: denies Onset: More than a month ago Quality: Burning, Sharp, Tingling Severity: 6 /10 (self-reported pain score)  Note: Reported level is compatible with observation. Clinically the patient looks like a 2/10 A 2/10 is viewed as "Mild to Moderate" and described as noticeable and distracting. Impossible to hide from other people. More frequent flare-ups. Still possible to adapt and function close to normal. It can be very annoying and may have occasional stronger flare-ups. With discipline, patients may get used to it and adapt. Information on the proper use of the pain scale provided to the patient today. When using our objective Pain Scale, levels between 6 and 10/10 are said to belong in an emergency room, as it progressively worsens from a 6/10, described as severely limiting, requiring emergency care not usually available at an outpatient pain management facility. At a 6/10 level, communication becomes difficult and requires great effort. Assistance to reach the emergency department may be  required. Facial flushing and profuse sweating along with potentially dangerous increases in heart rate and blood pressure will be evident. Timing: Constant Modifying factors: ice, heat, positioning, immobilization, ibuprofen,   Onset and Duration: Gradual and Present longer than 3 months Cause of pain: total knee replacement. Severity: NAS-11 at its worse: 10/10, NAS-11 at its best: 2/10, NAS-11 now: 5/10 and NAS-11 on the average:  Timing: During activity or exercise, After activity or exercise and After a period of immobility Aggravating Factors: Bending, Climbing, Kneeling, Motion and Squatting Alleviating Factors: Cold packs, Hot packs, Lying down, Resting, Sleeping and Using a brace Associated Problems: Pain that does not allow patient to sleep Quality of Pain: Agonizing, Burning, Feeling of constriction, Sharp, Shooting, Stabbing, Tender, Throbbing and Tingling Previous Examinations or Tests: X-rays and Orthopedic evaluation Previous Treatments: Physical Therapy and Trigger point injections  The patient comes into the clinics today for the first time for a chronic pain management evaluation. According to the patient her primary area of pain is in her left knee. She denies any precipitating factor. She is status post left total knee replacement Dr.Olcott approximately 13 months ago. She admits that this was not effective for her knee pain. She did have arthroscopic procedure approximately 4 months ago by Dr.Wooten for evaluation of chronic left knee pain. She admits that prior to her surgery she did have 3 steroid injections. She admits that 2 were effective however she did have an adverse reaction after the third injection this is for prompted her to have the surgery. She admits that she has had 3 different sessions of physical therapy. She admits that this did help her range of motion after  surgery however he has not been effective for her pain. She has had recent images.   Today I took the  time to provide the patient with information regarding this pain practice. The patient was informed that the practice is divided into two sections: an interventional pain management section, as well as a completely separate and distinct medication management section. I explained that there are procedure days for interventional therapies, and evaluation days for follow-ups and medication management. Because of the amount of documentation required during both, they are kept separated. This means that there is the possibility that she may be scheduled for a procedure on one day, and medication management the next. I have also informed her that because of staffing and facility limitations, this practice will no longer take patients for medication management only. To illustrate the reasons for this, I gave the patient the example of surgeons, and how inappropriate it would be to refer a patient to his/her care, just to write for the post-surgical antibiotics on a surgery done by a different surgeon.   Because interventional pain management is part of the board-certified specialty for the doctors, the patient was informed that joining this practice means that they are open to any and all interventional therapies. I made it clear that this does not mean that they will be forced to have any procedures done. What this means is that I believe interventional therapies to be essential part of the diagnosis and proper management of chronic pain conditions. Therefore, patients not interested in these interventional alternatives will be better served under the care of a different practitioner.  The patient was also made aware of my Comprehensive Pain Management Safety Guidelines where by joining this practice, they limit all of their nerve blocks and joint injections to those done by our practice, for as long as we are retained to manage their care. Historic Controlled Substance Pharmacotherapy Review  PMP and historical  list of controlled substances: Hydrocodone/acetaminophen 5/325 mg, oxycodone 5 mg, tramadol 50 mg, alprazolam 0.5 mg, Virtussin liquid, Guaiatussin liquid Highest opioid analgesic regimen found: Oxycodone 5 mg 2 tablets 5 times daily (fill date 06/19/2017) oxycodone 50 mg daily Most recent opioid analgesic: Hydrocodone/acetaminophen 5/325 mg 1-2 tablets every 4 hours (fill date 06/27/2017) hydrocodone 37.5 mg per day Current opioid analgesics: None Highest recorded MME/day: 75 mg/day MME/day: 16m/day Medications: The patient did not bring the medication(s) to the appointment, as requested in our "New Patient Package" Pharmacodynamics: Desired effects: Analgesia: The patient reports >50% benefit. Reported improvement in function: The patient reports medication allows her to accomplish basic ADLs. Clinically meaningful improvement in function (CMIF): Sustained CMIF goals met Perceived effectiveness: Described as relatively effective, allowing for increase in activities of daily living (ADL) Undesirable effects: Side-effects or Adverse reactions: None reported Historical Monitoring: The patient  reports that she does not use drugs. List of all UDS Test(s): No results found for: MDMA, COCAINSCRNUR, PCPSCRNUR, PCPQUANT, CANNABQUANT, THCU, EAshtonList of all Serum Drug Screening Test(s):  No results found for: AMPHSCRSER, BARBSCRSER, BENZOSCRSER, COCAINSCRSER, PCPSCRSER, PCPQUANT, THCSCRSER, CANNABQUANT, OPIATESCRSER, OXYSCRSER, PROPOXSCRSER Historical Background Evaluation: [place] PDMP: Six (6) year initial data search conducted.             [place] Department of public safety, offender search: (Editor, commissioningInformation) Non-contributory Risk Assessment Profile: Aberrant behavior: None observed or detected today Risk factors for fatal opioid overdose: None identified today Fatal overdose hazard ratio (HR): Calculation deferred Non-fatal overdose hazard ratio (HR): Calculation deferred Risk of opioid abuse or  dependence: 0.7-3.0% with doses ? 36 MME/day and 6.1-26% with doses ? 120 MME/day. Substance use disorder (SUD) risk level: Pending results of Medical Psychology Evaluation for SUD Opioid risk tool (ORT) (Total Score): 1  ORT Scoring interpretation table:  Score <3 = Low Risk for SUD  Score between 4-7 = Moderate Risk for SUD  Score >8 = High Risk for Opioid Abuse   PHQ-2 Depression Scale:  Total score: 0  PHQ-2 Scoring interpretation table: (Score and probability of major depressive disorder)  Score 0 = No depression  Score 1 = 15.4% Probability  Score 2 = 21.1% Probability  Score 3 = 38.4% Probability  Score 4 = 45.5% Probability  Score 5 = 56.4% Probability  Score 6 = 78.6% Probability   PHQ-9 Depression Scale:  Total score: 0  PHQ-9 Scoring interpretation table:  Score 0-4 = No depression  Score 5-9 = Mild depression  Score 10-14 = Moderate depression  Score 15-19 = Moderately severe depression  Score 20-27 = Severe depression (2.4 times higher risk of SUD and 2.89 times higher risk of overuse)   Pharmacologic Plan: Pending ordered tests and/or consults  Meds  The patient has a current medication list which includes the following prescription(s): acetaminophen, albuterol, amitriptyline, amlodipine, ezetimibe, glimepiride, ibuprofen, lidocaine hcl, metformin, omeprazole, quinapril, and apixaban.  Current Outpatient Medications on File Prior to Visit  Medication Sig  . acetaminophen (TYLENOL) 500 MG tablet Take 1,000 mg by mouth every 6 (six) hours as needed for moderate pain or headache.  . albuterol (PROVENTIL HFA;VENTOLIN HFA) 108 (90 Base) MCG/ACT inhaler Inhale 2 puffs into the lungs every 6 (six) hours as needed for wheezing or shortness of breath.  Marland Kitchen amitriptyline (ELAVIL) 50 MG tablet Take 50 mg by mouth at bedtime.  Marland Kitchen amLODipine (NORVASC) 5 MG tablet Take 5 mg by mouth daily. In evening  . ezetimibe (ZETIA) 10 MG tablet Take 10 mg by mouth daily. In evening  .  glimepiride (AMARYL) 2 MG tablet Take 2 mg by mouth daily.  Marland Kitchen ibuprofen (ADVIL,MOTRIN) 200 MG tablet Take 400 mg by mouth every 6 (six) hours as needed for headache or moderate pain.  . Lidocaine HCl (ASPERCREME LIDOCAINE) 4 % LIQD Apply 1 application topically at bedtime.  . metFORMIN (GLUCOPHAGE) 1000 MG tablet Take 1,000 mg by mouth daily with breakfast.   . omeprazole (PRILOSEC) 40 MG capsule Take 40 mg by mouth daily.  . quinapril (ACCUPRIL) 40 MG tablet Take 40 mg by mouth daily. In evening  . apixaban (ELIQUIS) 2.5 MG TABS tablet Take 1 tablet (2.5 mg total) by mouth 2 (two) times daily.   No current facility-administered medications on file prior to visit.    Imaging Review    Note: No new results found.        ROS  Cardiovascular History: Daily Aspirin intake and High blood pressure Pulmonary or Respiratory History: Snoring  and Temporary stoppage of breathing during sleep Neurological History: No reported neurological signs or symptoms such as seizures, abnormal skin sensations, urinary and/or fecal incontinence, being born with an abnormal open spine and/or a tethered spinal cord Review of Past Neurological Studies: No results found for this or any previous visit. Psychological-Psychiatric History: Anxiousness Gastrointestinal History: Reflux or heatburn Genitourinary History: No reported renal or genitourinary signs or symptoms such as difficulty voiding or producing urine, peeing blood, non-functioning kidney, kidney stones, difficulty emptying the bladder, difficulty controlling the flow of urine, or chronic kidney disease Hematological History: No reported hematological signs or  symptoms such as prolonged bleeding, low or poor functioning platelets, bruising or bleeding easily, hereditary bleeding problems, low energy levels due to low hemoglobin or being anemic Endocrine History: High blood sugar requiring insulin (IDDM) Rheumatologic History: No reported rheumatological  signs and symptoms such as fatigue, joint pain, tenderness, swelling, redness, heat, stiffness, decreased range of motion, with or without associated rash Musculoskeletal History: Negative for myasthenia gravis, muscular dystrophy, multiple sclerosis or malignant hyperthermia Work History: Retired  Allergies  Leslie Bass has No Known Allergies.  Laboratory Chemistry  Inflammation Markers No results found for: CRP, ESRSEDRATE (CRP: Acute Phase) (ESR: Chronic Phase) Renal Function Markers Lab Results  Component Value Date   BUN 24 (H) 06/12/2017   CREATININE 0.91 06/12/2017   GFRAA >60 06/12/2017   GFRNONAA >60 06/12/2017   Hepatic Function Markers No results found for: AST, ALT, ALBUMIN, ALKPHOS, HCVAB Electrolytes Lab Results  Component Value Date   NA 141 06/12/2017   K 4.7 06/12/2017   CL 106 06/12/2017   CALCIUM 9.0 06/12/2017   Neuropathy Markers No results found for: [id number] Bone Pathology Markers Lab Results  Component Value Date   CALCIUM 9.0 06/12/2017   Coagulation Parameters No results found for: INR, LABPROT, APTT, PLT Cardiovascular Markers No results found for: BNP, HGB, HCT Note: Lab results reviewed.  PFSH  Drug: Leslie Bass  reports that she does not use drugs. Alcohol:  reports that she does not drink alcohol. Tobacco:  reports that she quit smoking about 21 years ago. she has never used smokeless tobacco. Medical:  has a past medical history of Anxiety, Arthritis, Cancer ([place]), GERD (gastroesophageal reflux disease), Hypertension, Hypothyroidism, Psoriasis, guttate (2016), Pulmonary embolism ([place]) (2016), Sleep apnea, and Thyroid mass (2016). Family: family history is not on file.  Past Surgical History:  Procedure Laterality Date  . ABDOMINAL HYSTERECTOMY  1999  . Pulcifer  . JOINT REPLACEMENT Left 10/03/2016  . KNEE ARTHROSCOPY Left 06/19/2017   Procedure: ARTHROSCOPY KNEE, EXTENSIVE SYNOVECTOMY, AND LYSIS OF ADHESIONS;  Surgeon:  Dereck Leep, MD;  Location: [facility] ORS;  Service: Orthopedics;  Laterality: Left;  . MOHS SURGERY Right 2013   leg and upper lip  . TONSILLECTOMY  1961   Active Ambulatory Problems    Diagnosis Date Noted  . Chronic pain of left knee (Primary Area of Pain) 10/24/2017  . Chronic pain syndrome 10/24/2017  . Chronic pain of left lower extremity (Secondary Area of Pain) 10/24/2017  . Pharmacologic therapy 10/24/2017  . Disorder of skeletal system 10/24/2017  . Problems influencing health status 10/24/2017  . 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 No Resolved Ambulatory Problems   Past Medical History:  Diagnosis Date  . Anxiety   . Arthritis   . Cancer ([place])   . GERD (gastroesophageal reflux disease)   . Hypertension   . Hypothyroidism   . Psoriasis, guttate 2016  . Pulmonary embolism ([place]) 2016  . Sleep apnea   . Thyroid mass 2016   Constitutional Exam  General appearance: Well nourished, well developed, and well hydrated. In no apparent acute distress Vitals:   10/24/17 1013  BP: (!) 155/78  Pulse: (!) 114  Resp: 18  Temp: 98.9 F (37.2 C)  TempSrc: Oral  SpO2: 97%  Weight: 200 lb (90.7 kg)  Height: 5' 3"  (1.6 m)   BMI Assessment: Estimated body mass index is 35.43 kg/m as calculated from the following:  Height as of this encounter: 5' 3"  (1.6 m).   Weight as of this encounter: 200 lb (90.7 kg).  BMI interpretation table: BMI level Category Range association with higher incidence of chronic pain  <18 kg/m2 Underweight   18.5-24.9 kg/m2 Ideal body weight   25-29.9 kg/m2 Overweight Increased incidence by 20%  30-34.9 kg/m2 Obese (Class I) Increased incidence by 68%  35-39.9 kg/m2 Severe obesity (Class II) Increased incidence by 136%  >40 kg/m2 Extreme obesity (Class III) Increased incidence by 254%   BMI Readings from Last 4 Encounters:  10/24/17 35.43 kg/m  06/19/17 35.43  kg/m   Wt Readings from Last 4 Encounters:  10/24/17 200 lb (90.7 kg)  06/19/17 200 lb (90.7 kg)  Psych/Mental status: Alert, oriented x 3 (person, place, & time)       Eyes: PERLA Respiratory: No evidence of acute respiratory distress  Gait & Posture Assessment  Ambulation: Unassisted Gait: Relatively normal for age and body habitus Posture: WNL   Lower Extremity Exam    Side: Right lower extremity  Side: Left lower extremity  Inspection: No masses, redness, swelling, or asymmetry. No contractures  Inspection: No masses, redness, swelling, or asymmetry. No contractures  Functional ROM: Unrestricted ROM          Functional ROM: Unrestricted ROM          Muscle strength & Tone: Functionally intact  Muscle strength & Tone: Functionally intact  Sensory: Unimpaired  Sensory: Unimpaired  Palpation: No palpable anomalies  Palpation: No palpable anomalies   Assessment  Primary Diagnosis & Pertinent Problem List: The primary encounter diagnosis was Chronic pain of left knee (Primary Area of Pain). Diagnoses of Chronic pain of left lower extremity, Chronic pain syndrome, History of total knee replacement, left, Pharmacologic therapy, Disorder of skeletal system, and Problems influencing health status were also pertinent to this visit.  Visit Diagnosis: 1. Chronic pain of left knee (Primary Area of Pain)   2. Chronic pain of left lower extremity   3. Chronic pain syndrome   4. History of total knee replacement, left   5. Pharmacologic therapy   6. Disorder of skeletal system   7. Problems influencing health status    Plan of Care  Initial treatment plan:  Please be advised that as per protocol, today's visit has been an evaluation only. We have not taken over the patient's controlled substance management.  Problem-specific plan: No problem-specific Assessment & Plan notes found for this encounter.  Ordered Lab-work, Procedure(s), Referral(s), & Consult(s): Orders Placed This  Encounter  Procedures  . GENICULAR NERVE BLOCK  . Vitamin B12  . Magnesium  . Sedimentation rate  . 25-Hydroxyvitamin D Lcms D2+D3  . C-reactive protein  . Comp. Metabolic Panel (12)   Pharmacotherapy: Medications ordered:  No orders of the defined types were placed in this encounter.  Medications administered during this visit: Jashley B. Cowell had no medications administered during this visit.   Pharmacotherapy under consideration:  Opioid Analgesics: The patient was informed that there is no guarantee that she would be a candidate for opioid analgesics. The decision will be made following CDC guidelines. This decision will be based on the results of diagnostic studies, as well as Leslie Bass's risk profile.  Membrane stabilizer: To be determined at a later time Muscle relaxant: To be determined at a later time NSAID: To be determined at a later time Other analgesic(s): To be determined at a later time   Interventional therapies under consideration: Leslie Bass was informed  that there is no guarantee that she would be a candidate for interventional therapies. The decision will be based on the results of diagnostic studies, as well as Leslie Bass's risk profile.  Possible procedure(s): Diagnostic left genicular nerve block Possible left genicular nerve RFA   Provider-requested follow-up: Return for w/ Dr. Dossie Arbour, 2nd Visit.  No future appointments.  Primary Care Physician: System, Pcp Not In Location: [facility] Outpatient Pain Management Facility Note by:  Date: 10/24/2017; Time: 11:44 AM  Pain Score Disclaimer: We use the NRS-11 scale. This is a self-reported, subjective measurement of pain severity with only modest accuracy. It is used primarily to identify changes within a particular patient. It must be understood that outpatient pain scales are significantly less accurate that those used for research, where they can be applied under ideal controlled circumstances with minimal exposure to  variables. In reality, the score is likely to be a combination of pain intensity and pain affect, where pain affect describes the degree of emotional arousal or changes in action readiness caused by the sensory experience of pain. Factors such as social and work situation, setting, emotional state, anxiety levels, expectation, and prior pain experience may influence pain perception and show large inter-individual differences that may also be affected by time variables.  Patient instructions provided during this appointment: Patient Instructions    ____________________________________________________________________________________________  Appointment Policy Summary  It is our goal and responsibility to provide the medical community with assistance in the evaluation and management of patients with chronic pain. Unfortunately our resources are limited. Because we do not have an unlimited amount of time, or available appointments, we are required to closely monitor and manage their use. The following rules exist to maximize their use:  Patient's responsibilities: 1. Punctuality:  At what time should I arrive? You should be physically present in our office 30 minutes before your scheduled appointment. Your scheduled appointment is with your assigned healthcare provider. However, it takes 5-10 minutes to be "checked-in", and another 15 minutes for the nurses to do the admission. If you arrive to our office at the time you were given for your appointment, you will end up being at least 20-25 minutes late to your appointment with the provider. 2. Tardiness:  What happens if I arrive only a few minutes after my scheduled appointment time? You will need to reschedule your appointment. The cutoff is your appointment time. This is why it is so important that you arrive at least 30 minutes before that appointment. If you have an appointment scheduled for 10:00 AM and you arrive at 10:01, you will be required to  reschedule your appointment.  3. Plan ahead:  Always assume that you will encounter traffic on your way in. Plan for it. If you are dependent on a driver, make sure they understand these rules and the need to arrive early. 4. Other appointments and responsibilities:  Avoid scheduling any other appointments before or after your pain clinic appointments.  5. Be prepared:  Write down everything that you need to discuss with your healthcare provider and give this information to the admitting nurse. Write down the medications that you will need refilled. Bring your pills and bottles (even the empty ones), to all of your appointments, except for those where a procedure is scheduled. 6. No children or pets:  Find someone to take care of them. It is not appropriate to bring them in. 7. Scheduling changes:  We request "advanced notification" of any changes or cancellations. 8. Advanced notification:  Defined as a time period of more than 24 hours prior to the originally scheduled appointment. This allows for the appointment to be offered to other patients. 9. Rescheduling:  When a visit is rescheduled, it will require the cancellation of the original appointment. For this reason they both fall within the category of "Cancellations".  10. Cancellations:  They require advanced notification. Any cancellation less than 24 hours before the  appointment will be recorded as a "No Show". 11. No Show:  Defined as an unkept appointment where the patient failed to notify or declare to the practice their intention or inability to keep the appointment.  Corrective process for repeat offenders:  1. Tardiness: Three (3) episodes of rescheduling due to late arrivals will be recorded as one (1) "No Show". 2. Cancellation or reschedule: Three (3) cancellations or rescheduling will be recorded as one (1) "No Show". 3. "No Shows": Three (3) "No Shows" within a 12 month period will result in discharge from the  practice.  ____________________________________________________________________________________________   ____________________________________________________________________________________________  Pain Scale  Introduction: The pain score used by this practice is the Verbal Numerical Rating Scale (VNRS-11). This is an 11-point scale. It is for adults and children 10 years or older. There are significant differences in how the pain score is reported, used, and applied. Forget everything you learned in the past and learn this scoring system.  General Information: The scale should reflect your current level of pain. Unless you are specifically asked for the level of your worst pain, or your average pain. If you are asked for one of these two, then it should be understood that it is over the past 24 hours.  Basic Activities of Daily Living (ADL): Personal hygiene, dressing, eating, transferring, and using restroom.  Instructions: Most patients tend to report their level of pain as a combination of two factors, their physical pain and their psychosocial pain. This last one is also known as "suffering" and it is reflection of how physical pain affects you socially and psychologically. From now on, report them separately. From this point on, when asked to report your pain level, report only your physical pain. Use the following table for reference.  Pain Clinic Pain Levels (0-5/10)  Pain Level Score  Description  No Pain 0   Mild pain 1 Nagging, annoying, but does not interfere with basic activities of daily living (ADL). Patients are able to eat, bathe, get dressed, toileting (being able to get on and off the toilet and perform personal hygiene functions), transfer (move in and out of bed or a chair without assistance), and maintain continence (able to control bladder and bowel functions). Blood pressure and heart rate are unaffected. A normal heart rate for a healthy adult ranges from 60 to 100 bpm  (beats per minute).   Mild to moderate pain 2 Noticeable and distracting. Impossible to hide from other people. More frequent flare-ups. Still possible to adapt and function close to normal. It can be very annoying and may have occasional stronger flare-ups. With discipline, patients may get used to it and adapt.   Moderate pain 3 Interferes significantly with activities of daily living (ADL). It becomes difficult to feed, bathe, get dressed, get on and off the toilet or to perform personal hygiene functions. Difficult to get in and out of bed or a chair without assistance. Very distracting. With effort, it can be ignored when deeply involved in activities.   Moderately severe pain 4 Impossible to ignore for more than a few  minutes. With effort, patients may still be able to manage work or participate in some social activities. Very difficult to concentrate. Signs of autonomic nervous system discharge are evident: dilated pupils (mydriasis); mild sweating (diaphoresis); sleep interference. Heart rate becomes elevated (>115 bpm). Diastolic blood pressure (lower number) rises above 100 mmHg. Patients find relief in laying down and not moving.   Severe pain 5 Intense and extremely unpleasant. Associated with frowning face and frequent crying. Pain overwhelms the senses.  Ability to do any activity or maintain social relationships becomes significantly limited. Conversation becomes difficult. Pacing back and forth is common, as getting into a comfortable position is nearly impossible. Pain wakes you up from deep sleep. Physical signs will be obvious: pupillary dilation; increased sweating; goosebumps; brisk reflexes; cold, clammy hands and feet; nausea, vomiting or dry heaves; loss of appetite; significant sleep disturbance with inability to fall asleep or to remain asleep. When persistent, significant weight loss is observed due to the complete loss of appetite and sleep deprivation.  Blood pressure and heart  rate becomes significantly elevated. Caution: If elevated blood pressure triggers a pounding headache associated with blurred vision, then the patient should immediately seek attention at an urgent or emergency care unit, as these may be signs of an impending stroke.    Emergency Department Pain Levels (6-10/10)  Emergency Room Pain 6 Severely limiting. Requires emergency care and should not be seen or managed at an outpatient pain management facility. Communication becomes difficult and requires great effort. Assistance to reach the emergency department may be required. Facial flushing and profuse sweating along with potentially dangerous increases in heart rate and blood pressure will be evident.   Distressing pain 7 Self-care is very difficult. Assistance is required to transport, or use restroom. Assistance to reach the emergency department will be required. Tasks requiring coordination, such as bathing and getting dressed become very difficult.   Disabling pain 8 Self-care is no longer possible. At this level, pain is disabling. The individual is unable to do even the most "basic" activities such as walking, eating, bathing, dressing, transferring to a bed, or toileting. Fine motor skills are lost. It is difficult to think clearly.   Incapacitating pain 9 Pain becomes incapacitating. Thought processing is no longer possible. Difficult to remember your own name. Control of movement and coordination are lost.   The worst pain imaginable 10 At this level, most patients pass out from pain. When this level is reached, collapse of the autonomic nervous system occurs, leading to a sudden drop in blood pressure and heart rate. This in turn results in a temporary and dramatic drop in blood flow to the brain, leading to a loss of consciousness. Fainting is one of the body's self defense mechanisms. Passing out puts the brain in a calmed state and causes it to shut down for a while, in order to begin the  healing process.    Summary: 1. Refer to this scale when providing [place] with your pain level. 2. Be accurate and careful when reporting your pain level. This will help with your care. 3. Over-reporting your pain level will lead to loss of credibility. 4. Even a level of 1/10 means that there is pain and will be treated at our facility. 5. High, inaccurate reporting will be documented as "Symptom Exaggeration", leading to loss of credibility and suspicions of possible secondary gains such as obtaining more narcotics, or wanting to appear disabled, for fraudulent reasons. 6. Only pain levels of 5 or below will  be seen at our facility. 7. Pain levels of 6 and above will be sent to the Emergency Department and the appointment cancelled. ____________________________________________________________________________________________   ____________________________________________________________________________________________  Genicular Nerve Block  What is a genicular nerve block? A genicular nerve block is the injection of a local anesthetic to block the nerves that transmits pain from the knee.  What is the purpose of a facet nerve block? A genicular nerve block is a diagnostic procedure to determine if the pathologic changes (i.e. arthritis, meniscal tears, etc) and inflammation within the knee joint is the source of your knee pain. It also confirms that the knee pain will respond well to the actual treatment procedure. If a genicular nerve block works, it will give you relief for several hours. After that, the pain is expected to return to normal. This test is always performed twice (usually a week or two apart) because two successful tests are required to move onto treatment. If both diagnostic tests are positive, then we schedule a treatment called radiofrequency (RF) ablation. In this procedure, the same nerves are cauterized, which typically leads to pain relief for 4 -18 months. If this process  works well for one knee, it can be performed on the other knee if needed.  How is the procedure performed? You will be placed on the procedure table. The injection site is sterilized with either iodine or chlorhexadine. The site to be injected is numbed with a local anesthetic, and a needle is directed to the target area. X-ray guidance is used to ensure proper placement and positioning of the needle. When the needle is properly positioned near the genicular nerve, local anesthetic is injected to numb that nerve. This will be repeated at multiple sites around the knee to block all genicular nerves.  Will the procedure be painful? The injection can be painful and we therefore provide the option of receiving IV sedation. IV sedation, combined with local anesthetic, can make the injection nearly pain free. It allows you to remain very still during the procedure, which can also make the injection easier, faster, and more successful. If you decide to have IV sedation, you must have a driver to get you home safely afterwards. In addition, you cannot have anything to eat or drink within 8 hours of your appointment (clear liquids are allowed until 3 hours before the procedure). If you take medications for diabetes, these medications may need to be adjusted the morning of the procedure. Your primary care physician can help you with this adjustment.  What are the discharge instructions? If you received IV sedation do not drive or operate machinery for at least 24 hours after the procedure. You may return to work the next day following your procedure. You may resume your normal diet immediately. Do not engage in any strenuous activity for 24 hours. You should, however, engage in moderate activity that typically causes your ususal pain. If the block works, those activities should not be painful for several hours after the injection. Do not take a bath, swim, or use a hot tub for 24 hours (you may take a shower). Call  the office if you have any of the following: severe pain afterwards (different than your usual symptoms), redness/swelling/discharge at the injection site(s), fevers/chills, difficulty with bowel or bladder functions.  What are the risks and side effects? The complication rate for this procedure is very low. Whenever a needle enters the skin, bleeding or infection can occur. Some other serious but extremely rare risks include paralysis  and death. You may have an allergic reaction to any of the medications used. If you have a known allergy to any medications, especially local anesthetics, notify our staff before the procedure takes place. You may experience any of the following side effects up to 4 - 6 hours after the procedure: . Leg muscle weakness or numbness may occur due to the local anesthetic affecting the nerves that control your legs (this is a temporary affect and it is not paralysis). If you have any leg weakness or numbness, walk only with assistance in order to prevent falls and injury. Your leg strength will return slowly and completely. . Dizziness may occur due to a decrease in your blood pressure. If this occurs, remain in a seated or lying position. Gradually sit up, and then stand after at least 10 minutes of sitting. . Mild headaches may occur. Drink fluids and take pain medications if needed. If the headaches persist or become severe, call the office. . Mild discomfort at the injection site can occur. This typically lasts for a few hours but can persist for a couple days. If this occurs, take anti-inflammatories or pain medications, apply ice to the area the day of the procedure. If it persists, apply moist heat in the day(s) following.  The side effects listed above can be normal. They are not dangerous and will resolve on their own. If, however, you experience any of the following, a complication may have occurred and you should either contact your doctor. If he is not readily  available, then you should proceed to the closest urgent care center for evaluation: . Severe or progressive pain at the injection site(s) . Arm or leg weakness that progressively worsens or persists for longer than 8 hours . Severe or progressive redness, swelling, or discharge from the injections site(s) . Fevers, chills, nausea, or vomiting . Bowel or bladder dysfunction (i.e. inability to urinate or pass stool or difficulty controlling either)  How long does it take for the procedure to work? You should feel relief from your usual pain within the first hour. Again, this is only expected to last for several hours, at the most. Remember, you may be sore in the middle part of your back from the needles, and you must distinguish this from your usual pain. ____________________________________________________________________________________________  ____________________________________________________________________________________________  Genicular Nerve Block  What is a genicular nerve block? A genicular nerve block is the injection of a local anesthetic to block the nerves that transmits pain from the knee.  What is the purpose of a facet nerve block? A genicular nerve block is a diagnostic procedure to determine if the pathologic changes (i.e. arthritis, meniscal tears, etc) and inflammation within the knee joint is the source of your knee pain. It also confirms that the knee pain will respond well to the actual treatment procedure. If a genicular nerve block works, it will give you relief for several hours. After that, the pain is expected to return to normal. This test is always performed twice (usually a week or two apart) because two successful tests are required to move onto treatment. If both diagnostic tests are positive, then we schedule a treatment called radiofrequency (RF) ablation. In this procedure, the same nerves are cauterized, which typically leads to pain relief for 4 -18  months. If this process works well for one knee, it can be performed on the other knee if needed.  How is the procedure performed? You will be placed on the procedure table. The injection  site is sterilized with either iodine or chlorhexadine. The site to be injected is numbed with a local anesthetic, and a needle is directed to the target area. X-ray guidance is used to ensure proper placement and positioning of the needle. When the needle is properly positioned near the genicular nerve, local anesthetic is injected to numb that nerve. This will be repeated at multiple sites around the knee to block all genicular nerves.  Will the procedure be painful? The injection can be painful and we therefore provide the option of receiving IV sedation. IV sedation, combined with local anesthetic, can make the injection nearly pain free. It allows you to remain very still during the procedure, which can also make the injection easier, faster, and more successful. If you decide to have IV sedation, you must have a driver to get you home safely afterwards. In addition, you cannot have anything to eat or drink within 8 hours of your appointment (clear liquids are allowed until 3 hours before the procedure). If you take medications for diabetes, these medications may need to be adjusted the morning of the procedure. Your primary care physician can help you with this adjustment.  What are the discharge instructions? If you received IV sedation do not drive or operate machinery for at least 24 hours after the procedure. You may return to work the next day following your procedure. You may resume your normal diet immediately. Do not engage in any strenuous activity for 24 hours. You should, however, engage in moderate activity that typically causes your ususal pain. If the block works, those activities should not be painful for several hours after the injection. Do not take a bath, swim, or use a hot tub for 24 hours (you  may take a shower). Call the office if you have any of the following: severe pain afterwards (different than your usual symptoms), redness/swelling/discharge at the injection site(s), fevers/chills, difficulty with bowel or bladder functions.  What are the risks and side effects? The complication rate for this procedure is very low. Whenever a needle enters the skin, bleeding or infection can occur. Some other serious but extremely rare risks include paralysis and death. You may have an allergic reaction to any of the medications used. If you have a known allergy to any medications, especially local anesthetics, notify our staff before the procedure takes place. You may experience any of the following side effects up to 4 - 6 hours after the procedure: . Leg muscle weakness or numbness may occur due to the local anesthetic affecting the nerves that control your legs (this is a temporary affect and it is not paralysis). If you have any leg weakness or numbness, walk only with assistance in order to prevent falls and injury. Your leg strength will return slowly and completely. . Dizziness may occur due to a decrease in your blood pressure. If this occurs, remain in a seated or lying position. Gradually sit up, and then stand after at least 10 minutes of sitting. . Mild headaches may occur. Drink fluids and take pain medications if needed. If the headaches persist or become severe, call the office. . Mild discomfort at the injection site can occur. This typically lasts for a few hours but can persist for a couple days. If this occurs, take anti-inflammatories or pain medications, apply ice to the area the day of the procedure. If it persists, apply moist heat in the day(s) following.  The side effects listed above can be normal. They are not  dangerous and will resolve on their own. If, however, you experience any of the following, a complication may have occurred and you should either contact your doctor. If  he is not readily available, then you should proceed to the closest urgent care center for evaluation: . Severe or progressive pain at the injection site(s) . Arm or leg weakness that progressively worsens or persists for longer than 8 hours . Severe or progressive redness, swelling, or discharge from the injections site(s) . Fevers, chills, nausea, or vomiting . Bowel or bladder dysfunction (i.e. inability to urinate or pass stool or difficulty controlling either)  How long does it take for the procedure to work? You should feel relief from your usual pain within the first hour. Again, this is only expected to last for several hours, at the most. Remember, you may be sore in the middle part of your back from the needles, and you must distinguish this from your usual pain. ____________________________________________________________________________________________   BMI Assessment: Estimated body mass index is 35.43 kg/m as calculated from the following:   Height as of this encounter: 5' 3"  (1.6 m).   Weight as of this encounter: 200 lb (90.7 kg).  BMI interpretation table: BMI level Category Range association with higher incidence of chronic pain  <18 kg/m2 Underweight   18.5-24.9 kg/m2 Ideal body weight   25-29.9 kg/m2 Overweight Increased incidence by 20%  30-34.9 kg/m2 Obese (Class I) Increased incidence by 68%  35-39.9 kg/m2 Severe obesity (Class II) Increased incidence by 136%  >40 kg/m2 Extreme obesity (Class III) Increased incidence by 254%   BMI Readings from Last 4 Encounters:  10/24/17 35.43 kg/m  06/19/17 35.43 kg/m   Wt Readings from Last 4 Encounters:  10/24/17 200 lb (90.7 kg)  06/19/17 200 lb (90.7 kg)

## 2017-10-24 NOTE — Patient Instructions (Addendum)
____________________________________________________________________________________________  Appointment Policy Summary  It is our goal and responsibility to provide the medical community with assistance in the evaluation and management of patients with chronic pain. Unfortunately our resources are limited. Because we do not have an unlimited amount of time, or available appointments, we are required to closely monitor and manage their use. The following rules exist to maximize their use:  Patient's responsibilities: 1. Punctuality:  At what time should I arrive? You should be physically present in our office 30 minutes before your scheduled appointment. Your scheduled appointment is with your assigned healthcare provider. However, it takes 5-10 minutes to be "checked-in", and another 15 minutes for the nurses to do the admission. If you arrive to our office at the time you were given for your appointment, you will end up being at least 20-25 minutes late to your appointment with the provider. 2. Tardiness:  What happens if I arrive only a few minutes after my scheduled appointment time? You will need to reschedule your appointment. The cutoff is your appointment time. This is why it is so important that you arrive at least 30 minutes before that appointment. If you have an appointment scheduled for 10:00 AM and you arrive at 10:01, you will be required to reschedule your appointment.  3. Plan ahead:  Always assume that you will encounter traffic on your way in. Plan for it. If you are dependent on a driver, make sure they understand these rules and the need to arrive early. 4. Other appointments and responsibilities:  Avoid scheduling any other appointments before or after your pain clinic appointments.  5. Be prepared:  Write down everything that you need to discuss with your healthcare provider and give this information to the admitting nurse. Write down the medications that you will need  refilled. Bring your pills and bottles (even the empty ones), to all of your appointments, except for those where a procedure is scheduled. 6. No children or pets:  Find someone to take care of them. It is not appropriate to bring them in. 7. Scheduling changes:  We request "advanced notification" of any changes or cancellations. 8. Advanced notification:  Defined as a time period of more than 24 hours prior to the originally scheduled appointment. This allows for the appointment to be offered to other patients. 9. Rescheduling:  When a visit is rescheduled, it will require the cancellation of the original appointment. For this reason they both fall within the category of "Cancellations".  10. Cancellations:  They require advanced notification. Any cancellation less than 24 hours before the  appointment will be recorded as a "No Show". 11. No Show:  Defined as an unkept appointment where the patient failed to notify or declare to the practice their intention or inability to keep the appointment.  Corrective process for repeat offenders:  1. Tardiness: Three (3) episodes of rescheduling due to late arrivals will be recorded as one (1) "No Show". 2. Cancellation or reschedule: Three (3) cancellations or rescheduling will be recorded as one (1) "No Show". 3. "No Shows": Three (3) "No Shows" within a 12 month period will result in discharge from the practice.  ____________________________________________________________________________________________   ____________________________________________________________________________________________  Pain Scale  Introduction: The pain score used by this practice is the Verbal Numerical Rating Scale (VNRS-11). This is an 11-point scale. It is for adults and children 10 years or older. There are significant differences in how the pain score is reported, used, and applied. Forget everything you learned in the past  and learn this scoring  system.  General Information: The scale should reflect your current level of pain. Unless you are specifically asked for the level of your worst pain, or your average pain. If you are asked for one of these two, then it should be understood that it is over the past 24 hours.  Basic Activities of Daily Living (ADL): Personal hygiene, dressing, eating, transferring, and using restroom.  Instructions: Most patients tend to report their level of pain as a combination of two factors, their physical pain and their psychosocial pain. This last one is also known as "suffering" and it is reflection of how physical pain affects you socially and psychologically. From now on, report them separately. From this point on, when asked to report your pain level, report only your physical pain. Use the following table for reference.  Pain Clinic Pain Levels (0-5/10)  Pain Level Score  Description  No Pain 0   Mild pain 1 Nagging, annoying, but does not interfere with basic activities of daily living (ADL). Patients are able to eat, bathe, get dressed, toileting (being able to get on and off the toilet and perform personal hygiene functions), transfer (move in and out of bed or a chair without assistance), and maintain continence (able to control bladder and bowel functions). Blood pressure and heart rate are unaffected. A normal heart rate for a healthy adult ranges from 60 to 100 bpm (beats per minute).   Mild to moderate pain 2 Noticeable and distracting. Impossible to hide from other people. More frequent flare-ups. Still possible to adapt and function close to normal. It can be very annoying and may have occasional stronger flare-ups. With discipline, patients may get used to it and adapt.   Moderate pain 3 Interferes significantly with activities of daily living (ADL). It becomes difficult to feed, bathe, get dressed, get on and off the toilet or to perform personal hygiene functions. Difficult to get in and out of  bed or a chair without assistance. Very distracting. With effort, it can be ignored when deeply involved in activities.   Moderately severe pain 4 Impossible to ignore for more than a few minutes. With effort, patients may still be able to manage work or participate in some social activities. Very difficult to concentrate. Signs of autonomic nervous system discharge are evident: dilated pupils (mydriasis); mild sweating (diaphoresis); sleep interference. Heart rate becomes elevated (>115 bpm). Diastolic blood pressure (lower number) rises above 100 mmHg. Patients find relief in laying down and not moving.   Severe pain 5 Intense and extremely unpleasant. Associated with frowning face and frequent crying. Pain overwhelms the senses.  Ability to do any activity or maintain social relationships becomes significantly limited. Conversation becomes difficult. Pacing back and forth is common, as getting into a comfortable position is nearly impossible. Pain wakes you up from deep sleep. Physical signs will be obvious: pupillary dilation; increased sweating; goosebumps; brisk reflexes; cold, clammy hands and feet; nausea, vomiting or dry heaves; loss of appetite; significant sleep disturbance with inability to fall asleep or to remain asleep. When persistent, significant weight loss is observed due to the complete loss of appetite and sleep deprivation.  Blood pressure and heart rate becomes significantly elevated. Caution: If elevated blood pressure triggers a pounding headache associated with blurred vision, then the patient should immediately seek attention at an urgent or emergency care unit, as these may be signs of an impending stroke.    Emergency Department Pain Levels (6-10/10)  Emergency Room Pain  6 Severely limiting. Requires emergency care and should not be seen or managed at an outpatient pain management facility. Communication becomes difficult and requires great effort. Assistance to reach the  emergency department may be required. Facial flushing and profuse sweating along with potentially dangerous increases in heart rate and blood pressure will be evident.   Distressing pain 7 Self-care is very difficult. Assistance is required to transport, or use restroom. Assistance to reach the emergency department will be required. Tasks requiring coordination, such as bathing and getting dressed become very difficult.   Disabling pain 8 Self-care is no longer possible. At this level, pain is disabling. The individual is unable to do even the most "basic" activities such as walking, eating, bathing, dressing, transferring to a bed, or toileting. Fine motor skills are lost. It is difficult to think clearly.   Incapacitating pain 9 Pain becomes incapacitating. Thought processing is no longer possible. Difficult to remember your own name. Control of movement and coordination are lost.   The worst pain imaginable 10 At this level, most patients pass out from pain. When this level is reached, collapse of the autonomic nervous system occurs, leading to a sudden drop in blood pressure and heart rate. This in turn results in a temporary and dramatic drop in blood flow to the brain, leading to a loss of consciousness. Fainting is one of the body's self defense mechanisms. Passing out puts the brain in a calmed state and causes it to shut down for a while, in order to begin the healing process.    Summary: 1. Refer to this scale when providing [place] with your pain level. 2. Be accurate and careful when reporting your pain level. This will help with your care. 3. Over-reporting your pain level will lead to loss of credibility. 4. Even a level of 1/10 means that there is pain and will be treated at our facility. 5. High, inaccurate reporting will be documented as "Symptom Exaggeration", leading to loss of credibility and suspicions of possible secondary gains such as obtaining more narcotics, or wanting to appear  disabled, for fraudulent reasons. 6. Only pain levels of 5 or below will be seen at our facility. 7. Pain levels of 6 and above will be sent to the Emergency Department and the appointment cancelled. ____________________________________________________________________________________________   ____________________________________________________________________________________________  Genicular Nerve Block  What is a genicular nerve block? A genicular nerve block is the injection of a local anesthetic to block the nerves that transmits pain from the knee.  What is the purpose of a facet nerve block? A genicular nerve block is a diagnostic procedure to determine if the pathologic changes (i.e. arthritis, meniscal tears, etc) and inflammation within the knee joint is the source of your knee pain. It also confirms that the knee pain will respond well to the actual treatment procedure. If a genicular nerve block works, it will give you relief for several hours. After that, the pain is expected to return to normal. This test is always performed twice (usually a week or two apart) because two successful tests are required to move onto treatment. If both diagnostic tests are positive, then we schedule a treatment called radiofrequency (RF) ablation. In this procedure, the same nerves are cauterized, which typically leads to pain relief for 4 -18 months. If this process works well for one knee, it can be performed on the other knee if needed.  How is the procedure performed? You will be placed on the procedure table. The injection site  is sterilized with either iodine or chlorhexadine. The site to be injected is numbed with a local anesthetic, and a needle is directed to the target area. X-ray guidance is used to ensure proper placement and positioning of the needle. When the needle is properly positioned near the genicular nerve, local anesthetic is injected to numb that nerve. This will be repeated at  multiple sites around the knee to block all genicular nerves.  Will the procedure be painful? The injection can be painful and we therefore provide the option of receiving IV sedation. IV sedation, combined with local anesthetic, can make the injection nearly pain free. It allows you to remain very still during the procedure, which can also make the injection easier, faster, and more successful. If you decide to have IV sedation, you must have a driver to get you home safely afterwards. In addition, you cannot have anything to eat or drink within 8 hours of your appointment (clear liquids are allowed until 3 hours before the procedure). If you take medications for diabetes, these medications may need to be adjusted the morning of the procedure. Your primary care physician can help you with this adjustment.  What are the discharge instructions? If you received IV sedation do not drive or operate machinery for at least 24 hours after the procedure. You may return to work the next day following your procedure. You may resume your normal diet immediately. Do not engage in any strenuous activity for 24 hours. You should, however, engage in moderate activity that typically causes your ususal pain. If the block works, those activities should not be painful for several hours after the injection. Do not take a bath, swim, or use a hot tub for 24 hours (you may take a shower). Call the office if you have any of the following: severe pain afterwards (different than your usual symptoms), redness/swelling/discharge at the injection site(s), fevers/chills, difficulty with bowel or bladder functions.  What are the risks and side effects? The complication rate for this procedure is very low. Whenever a needle enters the skin, bleeding or infection can occur. Some other serious but extremely rare risks include paralysis and death. You may have an allergic reaction to any of the medications used. If you have a known allergy  to any medications, especially local anesthetics, notify our staff before the procedure takes place. You may experience any of the following side effects up to 4 - 6 hours after the procedure: . Leg muscle weakness or numbness may occur due to the local anesthetic affecting the nerves that control your legs (this is a temporary affect and it is not paralysis). If you have any leg weakness or numbness, walk only with assistance in order to prevent falls and injury. Your leg strength will return slowly and completely. . Dizziness may occur due to a decrease in your blood pressure. If this occurs, remain in a seated or lying position. Gradually sit up, and then stand after at least 10 minutes of sitting. . Mild headaches may occur. Drink fluids and take pain medications if needed. If the headaches persist or become severe, call the office. . Mild discomfort at the injection site can occur. This typically lasts for a few hours but can persist for a couple days. If this occurs, take anti-inflammatories or pain medications, apply ice to the area the day of the procedure. If it persists, apply moist heat in the day(s) following.  The side effects listed above can be normal. They are not  dangerous and will resolve on their own. If, however, you experience any of the following, a complication may have occurred and you should either contact your doctor. If he is not readily available, then you should proceed to the closest urgent care center for evaluation: . Severe or progressive pain at the injection site(s) . Arm or leg weakness that progressively worsens or persists for longer than 8 hours . Severe or progressive redness, swelling, or discharge from the injections site(s) . Fevers, chills, nausea, or vomiting . Bowel or bladder dysfunction (i.e. inability to urinate or pass stool or difficulty controlling either)  How long does it take for the procedure to work? You should feel relief from your usual pain  within the first hour. Again, this is only expected to last for several hours, at the most. Remember, you may be sore in the middle part of your back from the needles, and you must distinguish this from your usual pain. ____________________________________________________________________________________________  ____________________________________________________________________________________________  Genicular Nerve Block  What is a genicular nerve block? A genicular nerve block is the injection of a local anesthetic to block the nerves that transmits pain from the knee.  What is the purpose of a facet nerve block? A genicular nerve block is a diagnostic procedure to determine if the pathologic changes (i.e. arthritis, meniscal tears, etc) and inflammation within the knee joint is the source of your knee pain. It also confirms that the knee pain will respond well to the actual treatment procedure. If a genicular nerve block works, it will give you relief for several hours. After that, the pain is expected to return to normal. This test is always performed twice (usually a week or two apart) because two successful tests are required to move onto treatment. If both diagnostic tests are positive, then we schedule a treatment called radiofrequency (RF) ablation. In this procedure, the same nerves are cauterized, which typically leads to pain relief for 4 -18 months. If this process works well for one knee, it can be performed on the other knee if needed.  How is the procedure performed? You will be placed on the procedure table. The injection site is sterilized with either iodine or chlorhexadine. The site to be injected is numbed with a local anesthetic, and a needle is directed to the target area. X-ray guidance is used to ensure proper placement and positioning of the needle. When the needle is properly positioned near the genicular nerve, local anesthetic is injected to numb that nerve. This will  be repeated at multiple sites around the knee to block all genicular nerves.  Will the procedure be painful? The injection can be painful and we therefore provide the option of receiving IV sedation. IV sedation, combined with local anesthetic, can make the injection nearly pain free. It allows you to remain very still during the procedure, which can also make the injection easier, faster, and more successful. If you decide to have IV sedation, you must have a driver to get you home safely afterwards. In addition, you cannot have anything to eat or drink within 8 hours of your appointment (clear liquids are allowed until 3 hours before the procedure). If you take medications for diabetes, these medications may need to be adjusted the morning of the procedure. Your primary care physician can help you with this adjustment.  What are the discharge instructions? If you received IV sedation do not drive or operate machinery for at least 24 hours after the procedure. You may return to  work the next day following your procedure. You may resume your normal diet immediately. Do not engage in any strenuous activity for 24 hours. You should, however, engage in moderate activity that typically causes your ususal pain. If the block works, those activities should not be painful for several hours after the injection. Do not take a bath, swim, or use a hot tub for 24 hours (you may take a shower). Call the office if you have any of the following: severe pain afterwards (different than your usual symptoms), redness/swelling/discharge at the injection site(s), fevers/chills, difficulty with bowel or bladder functions.  What are the risks and side effects? The complication rate for this procedure is very low. Whenever a needle enters the skin, bleeding or infection can occur. Some other serious but extremely rare risks include paralysis and death. You may have an allergic reaction to any of the medications used. If you have  a known allergy to any medications, especially local anesthetics, notify our staff before the procedure takes place. You may experience any of the following side effects up to 4 - 6 hours after the procedure: . Leg muscle weakness or numbness may occur due to the local anesthetic affecting the nerves that control your legs (this is a temporary affect and it is not paralysis). If you have any leg weakness or numbness, walk only with assistance in order to prevent falls and injury. Your leg strength will return slowly and completely. . Dizziness may occur due to a decrease in your blood pressure. If this occurs, remain in a seated or lying position. Gradually sit up, and then stand after at least 10 minutes of sitting. . Mild headaches may occur. Drink fluids and take pain medications if needed. If the headaches persist or become severe, call the office. . Mild discomfort at the injection site can occur. This typically lasts for a few hours but can persist for a couple days. If this occurs, take anti-inflammatories or pain medications, apply ice to the area the day of the procedure. If it persists, apply moist heat in the day(s) following.  The side effects listed above can be normal. They are not dangerous and will resolve on their own. If, however, you experience any of the following, a complication may have occurred and you should either contact your doctor. If he is not readily available, then you should proceed to the closest urgent care center for evaluation: . Severe or progressive pain at the injection site(s) . Arm or leg weakness that progressively worsens or persists for longer than 8 hours . Severe or progressive redness, swelling, or discharge from the injections site(s) . Fevers, chills, nausea, or vomiting . Bowel or bladder dysfunction (i.e. inability to urinate or pass stool or difficulty controlling either)  How long does it take for the procedure to work? You should feel relief from  your usual pain within the first hour. Again, this is only expected to last for several hours, at the most. Remember, you may be sore in the middle part of your back from the needles, and you must distinguish this from your usual pain. ____________________________________________________________________________________________   BMI Assessment: Estimated body mass index is 35.43 kg/m as calculated from the following:   Height as of this encounter: 5\' 3"  (1.6 m).   Weight as of this encounter: 200 lb (90.7 kg).  BMI interpretation table: BMI level Category Range association with higher incidence of chronic pain  <18 kg/m2 Underweight   18.5-24.9 kg/m2 Ideal body weight  25-29.9 kg/m2 Overweight Increased incidence by 20%  30-34.9 kg/m2 Obese (Class I) Increased incidence by 68%  35-39.9 kg/m2 Severe obesity (Class II) Increased incidence by 136%  >40 kg/m2 Extreme obesity (Class III) Increased incidence by 254%   BMI Readings from Last 4 Encounters:  10/24/17 35.43 kg/m  06/19/17 35.43 kg/m   Wt Readings from Last 4 Encounters:  10/24/17 200 lb (90.7 kg)  06/19/17 200 lb (90.7 kg)

## 2017-10-28 LAB — COMP. METABOLIC PANEL (12)
AST: 14 IU/L (ref 0–40)
Albumin/Globulin Ratio: 1.7 (ref 1.2–2.2)
Albumin: 4.4 g/dL (ref 3.6–4.8)
Alkaline Phosphatase: 82 IU/L (ref 39–117)
BUN/Creatinine Ratio: 21 (ref 12–28)
BUN: 15 mg/dL (ref 8–27)
Bilirubin Total: <0.2 mg/dL (ref 0.0–1.2)
CALCIUM: 9.6 mg/dL (ref 8.7–10.3)
CREATININE: 0.72 mg/dL (ref 0.57–1.00)
Chloride: 103 mmol/L (ref 96–106)
GFR, EST AFRICAN AMERICAN: 99 mL/min/{1.73_m2} (ref >59)
GFR, EST NON AFRICAN AMERICAN: 86 mL/min/{1.73_m2} (ref >59)
GLUCOSE: 216 mg/dL — AB (ref 65–99)
Globulin, Total: 2.6 g/dL (ref 1.5–4.5)
Potassium: 4.4 mmol/L (ref 3.5–5.2)
Sodium: 145 mmol/L — ABNORMAL HIGH (ref 134–144)
Total Protein: 7 g/dL (ref 6.0–8.5)

## 2017-10-28 LAB — SEDIMENTATION RATE: Sed Rate: 5 mm/hr (ref 0–40)

## 2017-10-28 LAB — VITAMIN B12: VITAMIN B 12: 1440 pg/mL — AB (ref 232–1245)

## 2017-10-28 LAB — 25-HYDROXY VITAMIN D LCMS D2+D3: 25-Hydroxy, Vitamin D: 35 ng/mL

## 2017-10-28 LAB — 25-HYDROXYVITAMIN D LCMS D2+D3: 25-HYDROXY, VITAMIN D-3: 35 ng/mL

## 2017-10-28 LAB — MAGNESIUM: Magnesium: 1.9 mg/dL (ref 1.6–2.3)

## 2017-10-28 LAB — C-REACTIVE PROTEIN: CRP: 4 mg/L (ref 0.0–4.9)

## 2017-11-12 ENCOUNTER — Other Ambulatory Visit: Payer: Self-pay

## 2017-11-12 ENCOUNTER — Ambulatory Visit
Admission: RE | Admit: 2017-11-12 | Discharge: 2017-11-12 | Disposition: A | Discharge status: Home / Self Care | Payer: Medicare HMO | Source: Ambulatory Visit | Attending: Pain Medicine | Admitting: Pain Medicine

## 2017-11-12 ENCOUNTER — Ambulatory Visit (HOSPITAL_BASED_OUTPATIENT_CLINIC_OR_DEPARTMENT_OTHER): Payer: Medicare HMO | Admitting: Pain Medicine

## 2017-11-12 ENCOUNTER — Encounter: Payer: Self-pay | Admitting: Pain Medicine

## 2017-11-12 VITALS — BP 161/76 | HR 85 | Temp 98.1°F | Resp 18 | Ht 63.0 in | Wt 200.0 lb

## 2017-11-12 DIAGNOSIS — M25562 Pain in left knee: Secondary | ICD-10-CM | POA: Diagnosis present

## 2017-11-12 DIAGNOSIS — G8929 Other chronic pain: Secondary | ICD-10-CM | POA: Insufficient documentation

## 2017-11-12 DIAGNOSIS — M1711 Unilateral primary osteoarthritis, right knee: Secondary | ICD-10-CM

## 2017-11-12 DIAGNOSIS — Z96652 Presence of left artificial knee joint: Secondary | ICD-10-CM | POA: Insufficient documentation

## 2017-11-12 DIAGNOSIS — T8484XS Pain due to internal orthopedic prosthetic devices, implants and grafts, sequela: Secondary | ICD-10-CM | POA: Insufficient documentation

## 2017-11-12 DIAGNOSIS — M79605 Pain in left leg: Secondary | ICD-10-CM | POA: Diagnosis not present

## 2017-11-12 DIAGNOSIS — Y838 Other surgical procedures as the cause of abnormal reaction of the patient, or of later complication, without mention of misadventure at the time of the procedure: Secondary | ICD-10-CM | POA: Diagnosis not present

## 2017-11-12 DIAGNOSIS — M170 Bilateral primary osteoarthritis of knee: Secondary | ICD-10-CM | POA: Insufficient documentation

## 2017-11-12 MED ORDER — METHYLPREDNISOLONE ACETATE 80 MG/ML IJ SUSP
80.0000 mg | Freq: Once | INTRAMUSCULAR | Status: AC
  Administered 2017-11-12: 80 mg
  Filled 2017-11-12: qty 1

## 2017-11-12 MED ORDER — FENTANYL CITRATE (PF) 100 MCG/2ML IJ SOLN
25.0000 ug | INTRAMUSCULAR | Status: DC | PRN
  Administered 2017-11-12: 100 ug via INTRAVENOUS
  Filled 2017-11-12: qty 2

## 2017-11-12 MED ORDER — LIDOCAINE HCL 2 % IJ SOLN
20.0000 mL | Freq: Once | INTRAMUSCULAR | Status: AC
  Administered 2017-11-12: 400 mg
  Filled 2017-11-12: qty 20

## 2017-11-12 MED ORDER — LACTATED RINGERS IV SOLN
1000.0000 mL | Freq: Once | INTRAVENOUS | Status: AC
  Administered 2017-11-12: 1000 mL via INTRAVENOUS

## 2017-11-12 MED ORDER — MIDAZOLAM HCL 5 MG/5ML IJ SOLN
1.0000 mg | INTRAMUSCULAR | Status: DC | PRN
  Administered 2017-11-12: 3 mg via INTRAVENOUS
  Filled 2017-11-12: qty 5

## 2017-11-12 MED ORDER — ROPIVACAINE HCL 2 MG/ML IJ SOLN
9.0000 mL | Freq: Once | INTRAMUSCULAR | Status: AC
  Administered 2017-11-12: 10 mL
  Filled 2017-11-12: qty 10

## 2017-11-12 NOTE — Progress Notes (Signed)
Patient's Name: Leslie Bass  MRN: [id number]  Referring Provider: Vevelyn Francois, NP  DOB: 02-Nov-1945  PCP: System, Pcp Not In  DOS: 11/12/2017  Note by: Gaspar Cola, MD  Service setting: Ambulatory outpatient  Specialty: Interventional Pain Management  Patient type: Established  Location: [facility] (AMB) Pain Management Facility  Visit type: Interventional Procedure   Primary Reason for Visit: Interventional Pain Management Treatment. CC: Knee Pain (left)  Procedure:  Anesthesia, Analgesia, Anxiolysis:  Type: Genicular Nerves Block (Superior-lateral, Superior-medial, and Inferior-medial Genicular Nerves) #1  CPT: 10626      Primary Purpose: Diagnostic Region: Lateral, Anterior, and Medial aspects of the knee joint, above and below the knee joint proper. Level: Superior and inferior to the knee joint. Target Area: For Genicular Nerve block(s), the targets are: the superior-lateral genicular nerve, located in the lateral distal portion of the femoral shaft as it curves to form the lateral epicondyle, in the region of the distal femoral metaphysis; the superior-medial genicular nerve, located in the medial distal portion of the femoral shaft as it curves to form the medial epicondyle; and the inferior-medial genicular nerve, located in the medial, proximal portion of the tibial shaft, as it curves to form the medial epicondyle, in the region of the proximal tibial metaphysis. Approach: Anterior, percutaneous, ipsilateral approach. Laterality: Left knee Position: Modified Fowler's position with pillows under the targeted knee(s).  Type: Moderate (Conscious) Sedation combined with Local Anesthesia Indication(s): Analgesia and Anxiety Route: Intravenous (IV) IV Access: Secured Sedation: Meaningful verbal contact was maintained at all times during the procedure  Local Anesthetic: Lidocaine 1-2%   Indications: 1. 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 Pain due to internal  orthopedic knee prosthetic devices, implants and grafts, sequela (Left)   3. Chronic lower extremity pain (Secondary Area of Pain) (Left)    Pain Score: Pre-procedure: 4 /10 Post-procedure: 0-No pain/10  Pre-op Assessment:  Leslie Bass is a 72 y.o. (year old), female patient, seen today for interventional treatment. She  has a past surgical history that includes Joint replacement (Left, 10/03/2016); Abdominal hysterectomy (1999); Cesarean section (1977); Mohs surgery (Right, 2013); Tonsillectomy (1961); and Knee arthroscopy (Left, 06/19/2017). [name] has a current medication list which includes the following prescription(s): acetaminophen, albuterol, amitriptyline, amlodipine, ezetimibe, glimepiride, ibuprofen, lidocaine hcl, metformin, omeprazole, quinapril, and apixaban, and the following Facility-Administered Medications: fentanyl and midazolam. Her primarily concern today is the Knee Pain (left)  Initial Vital Signs:  Pulse Rate: 85 Temp: 98.5 F (36.9 C) Resp: 16 BP: 128/84 SpO2: 99 %  BMI: Estimated body mass index is 35.43 kg/m as calculated from the following:   Height as of this encounter: 5\' 3"  (1.6 m).   Weight as of this encounter: 200 lb (90.7 kg).  Risk Assessment: Allergies: Reviewed. She has No Known Allergies.  Allergy Precautions: None required Coagulopathies: Reviewed. None identified.  Blood-thinner therapy: None at this time Active Infection(s): Reviewed. None identified. Leslie Bass is afebrile  Site Confirmation: Leslie Bass was asked to confirm the procedure and laterality before marking the site Procedure checklist: Completed Consent: Before the procedure and under the influence of no sedative(s), amnesic(s), or anxiolytics, the patient was informed of the treatment options, risks and possible complications. To fulfill our ethical and legal obligations, as recommended by the American Medical Association's Code of Ethics, I have informed the patient of my clinical  impression; the nature and purpose of the treatment or procedure; the risks, benefits, and possible complications of the intervention; the  alternatives, including doing nothing; the risk(s) and benefit(s) of the alternative treatment(s) or procedure(s); and the risk(s) and benefit(s) of doing nothing. The patient was provided information about the general risks and possible complications associated with the procedure. These may include, but are not limited to: failure to achieve desired goals, infection, bleeding, organ or nerve damage, allergic reactions, paralysis, and death. In addition, the patient was informed of those risks and complications associated to the procedure, such as failure to decrease pain; infection; bleeding; organ or nerve damage with subsequent damage to sensory, motor, and/or autonomic systems, resulting in permanent pain, numbness, and/or weakness of one or several areas of the body; allergic reactions; (i.e.: anaphylactic reaction); and/or death. Furthermore, the patient was informed of those risks and complications associated with the medications. These include, but are not limited to: allergic reactions (i.e.: anaphylactic or anaphylactoid reaction(s)); adrenal axis suppression; blood sugar elevation that in diabetics may result in ketoacidosis or comma; water retention that in patients with history of congestive heart failure may result in shortness of breath, pulmonary edema, and decompensation with resultant heart failure; weight gain; swelling or edema; medication-induced neural toxicity; particulate matter embolism and blood vessel occlusion with resultant organ, and/or nervous system infarction; and/or aseptic necrosis of one or more joints. Finally, the patient was informed that Medicine is not an exact science; therefore, there is also the possibility of unforeseen or unpredictable risks and/or possible complications that may result in a catastrophic outcome. The patient  indicated having understood very clearly. We have given the patient no guarantees and we have made no promises. Enough time was given to the patient to ask questions, all of which were answered to the patient's satisfaction. Ms. Fess has indicated that she wanted to continue with the procedure. Attestation: I, the ordering provider, attest that I have discussed with the patient the benefits, risks, side-effects, alternatives, likelihood of achieving goals, and potential problems during recovery for the procedure that I have provided informed consent. Date  Time: 11/12/2017 12:36 PM  Pre-Procedure Preparation:  Monitoring: As per clinic protocol. Respiration, ETCO2, SpO2, BP, heart rate and rhythm monitor placed and checked for adequate function Safety Precautions: Patient was assessed for positional comfort and pressure points before starting the procedure. Time-out: I initiated and conducted the "Time-out" before starting the procedure, as per protocol. The patient was asked to participate by confirming the accuracy of the "Time Out" information. Verification of the correct person, site, and procedure were performed and confirmed by me, the nursing staff, and the patient. "Time-out" conducted as per Joint Commission's Universal Protocol (UP.01.01.01). Time: 1343  Description of Procedure Process:  Area Prepped: Entire knee area, from mid-thigh to mid-shin, lateral, anterior, and medial aspects. Prepping solution: ChloraPrep (2% chlorhexidine gluconate and 70% isopropyl alcohol) Safety Precautions: Aspiration looking for blood return was conducted prior to all injections. At no point did we inject any substances, as a needle was being advanced. No attempts were made at seeking any paresthesias. Safe injection practices and needle disposal techniques used. Medications properly checked for expiration dates. SDV (single dose vial) medications used. Latex Allergy precautions taken.   Description of the  Procedure: Protocol guidelines were followed. The patient was placed in position over the procedure table. The target area was identified and the area prepped in the usual manner. Skin desensitized using vapocoolant spray. Skin & deeper tissues infiltrated with local anesthetic. Appropriate amount of time allowed to pass for local anesthetics to take effect. The procedure needles were then advanced to  the target area. Proper needle placement secured. Negative aspiration confirmed. Solution injected in intermittent fashion, asking for systemic symptoms every 0.5cc of injectate. The needles were then removed and the area cleansed, making sure to leave some of the prepping solution back to take advantage of its long term bactericidal properties.  Vitals:   11/12/17 1403 11/12/17 1413 11/12/17 1423 11/12/17 1433  BP: (!) 147/73 (!) 162/78 (!) 155/85 (!) 161/76  Pulse:      Resp: (!) 22 18 17 18   Temp: 98.1 F (36.7 C)     TempSrc: Temporal     SpO2: 95% 95% 96% 97%  Weight:      Height:        Start Time: 1343 hrs. End Time: 1352 hrs. Materials:  Needle(s) Type: Regular needle Gauge: 22G Length: 3.5-in Medication(s): Please see orders for medications and dosing details.  Imaging Guidance (Non-Spinal):  Type of Imaging Technique: Fluoroscopy Guidance (Non-Spinal) Indication(s): Assistance in needle guidance and placement for procedures requiring needle placement in or near specific anatomical locations not easily accessible without such assistance. Exposure Time: Please see nurses notes. Contrast: Before injecting any contrast, we confirmed that the patient did not have an allergy to iodine, shellfish, or radiological contrast. Once satisfactory needle placement was completed at the desired level, radiological contrast was injected. Contrast injected under live fluoroscopy. No contrast complications. See chart for type and volume of contrast used. Fluoroscopic Guidance: I was personally present  during the use of fluoroscopy. "Tunnel Vision Technique" used to obtain the best possible view of the target area. Parallax error corrected before commencing the procedure. "Direction-depth-direction" technique used to introduce the needle under continuous pulsed fluoroscopy. Once target was reached, antero-posterior, oblique, and lateral fluoroscopic projection used confirm needle placement in all planes. Images permanently stored in EMR. Interpretation: I personally interpreted the imaging intraoperatively. Adequate needle placement confirmed in multiple planes. Appropriate spread of contrast into desired area was observed. No evidence of afferent or efferent intravascular uptake. Permanent images saved into the patient's record.  Antibiotic Prophylaxis:   Anti-infectives (From admission, onward)   None     Indication(s): None identified  Post-operative Assessment:  Post-procedure Vital Signs:  Pulse Rate: 85 Temp: 98.1 F (36.7 C) Resp: 18 BP: (!) 161/76 SpO2: 97 %  EBL: None  Complications: No immediate post-treatment complications observed by team, or reported by patient.  Note: The patient tolerated the entire procedure well. A repeat set of vitals were taken after the procedure and the patient was kept under observation following institutional policy, for this type of procedure. Post-procedural neurological assessment was performed, showing return to baseline, prior to discharge. The patient was provided with post-procedure discharge instructions, including a section on how to identify potential problems. Should any problems arise concerning this procedure, the patient was given instructions to immediately contact us, at any time, without hesitation. In any case, we plan to contact the patient by telephone for a follow-up status report regarding this interventional procedure.  Comments:  No additional relevant information.  Plan of Care   Imaging Orders     DG C-Arm 1-60 Min-No  Report  Procedure Orders     GENICULAR NERVE BLOCK  Medications ordered for procedure: Meds ordered this encounter  Medications  . midazolam (VERSED) 5 MG/5ML injection 1-2 mg    Make sure Flumazenil is available in the pyxis when using this medication. If oversedation occurs, administer 0.2 mg IV over 15 sec. If after 45 sec no response, administer 0.2 mg again  over 1 min; may repeat at 1 min intervals; not to exceed 4 doses (1 mg)  . fentaNYL (SUBLIMAZE) injection 25-50 mcg    Make sure Narcan is available in the pyxis when using this medication. In the event of respiratory depression (RR< 8/min): Titrate NARCAN (naloxone) in increments of 0.1 to 0.2 mg IV at 2-3 minute intervals, until desired degree of reversal.  . lactated ringers infusion 1,000 mL  . lidocaine (XYLOCAINE) 2 % (with pres) injection 400 mg  . ropivacaine (PF) 2 mg/mL (0.2%) (NAROPIN) injection 9 mL  . methylPREDNISolone acetate (DEPO-MEDROL) injection 80 mg   Medications administered: We administered midazolam, fentaNYL, lactated ringers, lidocaine, ropivacaine (PF) 2 mg/mL (0.2%), and methylPREDNISolone acetate.  See the medical record for exact dosing, route, and time of administration.  New Prescriptions   No medications on file   Disposition: Discharge home  Discharge Date & Time: 11/12/2017; 1439 hrs.   Physician-requested Follow-up: Return for post-procedure eval (2 wks), w/ Dr. Dossie Arbour.  Future Appointments  Date Time Provider [place]  11/25/2017  1:15 PM Milinda Pointer, MD [facility] None   Primary Care Physician: System, Pcp Not In Location: [facility] Note by: Gaspar Cola, MD Date: 11/12/2017; Time: 3:14 PM  Disclaimer:  Medicine is not an Chief Strategy Officer. The only guarantee in medicine is that nothing is guaranteed. It is important to note that the decision to proceed with this intervention was based on the information collected from the patient. The  Data and conclusions were drawn from the patient's questionnaire, the interview, and the physical examination. Because the information was provided in large part by the patient, it cannot be guaranteed that it has not been purposely or unconsciously manipulated. Every effort has been made to obtain as much relevant data as possible for this evaluation. It is important to note that the conclusions that lead to this procedure are derived in large part from the available data. Always take into account that the treatment will also be dependent on availability of resources and existing treatment guidelines, considered by other Pain Management Practitioners as being common knowledge and practice, at the time of the intervention. For Medico-Legal purposes, it is also important to point out that variation in procedural techniques and pharmacological choices are the acceptable norm. The indications, contraindications, technique, and results of the above procedure should only be interpreted and judged by a Board-Certified Interventional Pain Specialist with extensive familiarity and expertise in the same exact procedure and technique.

## 2017-11-12 NOTE — Patient Instructions (Addendum)
____________________________________________________________________________________________  Post-Procedure Discharge Instructions  Instructions:  Apply ice: Fill a plastic sandwich bag with crushed ice. Cover it with a small towel and apply to injection site. Apply for 15 minutes then remove x 15 minutes. Repeat sequence on day of procedure, until you go to bed. The purpose is to minimize swelling and discomfort after procedure.  Apply heat: Apply heat to procedure site starting the day following the procedure. The purpose is to treat any soreness and discomfort from the procedure.  Food intake: Start with clear liquids (like water) and advance to regular food, as tolerated.   Physical activities: Keep activities to a minimum for the first 8 hours after the procedure.   Driving: If you have received any sedation, you are not allowed to drive for 24 hours after your procedure.  Blood thinner: Restart your blood thinner 6 hours after your procedure. (Only for those taking blood thinners)  Insulin: As soon as you can eat, you may resume your normal dosing schedule. (Only for those taking insulin)  Infection prevention: Keep procedure site clean and dry.  Post-procedure Pain Diary: Extremely important that this be done correctly and accurately. Recorded information will be used to determine the next step in treatment.  Pain evaluated is that of treated area only. Do not include pain from an untreated area.  Complete every hour, on the hour, for the initial 8 hours. Set an alarm to help you do this part accurately.  Do not go to sleep and have it completed later. It will not be accurate.  Follow-up appointment: Keep your follow-up appointment after the procedure. Usually 2 weeks for most procedures. (6 weeks in the case of radiofrequency.) Bring you pain diary.   Expect:  From numbing medicine (AKA: Local Anesthetics): Numbness or decrease in pain.  Onset: Full effect within 15  minutes of injected.  Duration: It will depend on the type of local anesthetic used. On the average, 1 to 8 hours.   From steroids: Decrease in swelling or inflammation. Once inflammation is improved, relief of the pain will follow.  Onset of benefits: Depends on the amount of swelling present. The more swelling, the longer it will take for the benefits to be seen. In some cases, up to 10 days.  Duration: Steroids will stay in the system x 2 weeks. Duration of benefits will depend on multiple posibilities including persistent irritating factors.  From procedure: Some discomfort is to be expected once the numbing medicine wears off. This should be minimal if ice and heat are applied as instructed.  Call if:  You experience numbness and weakness that gets worse with time, as opposed to wearing off.  New onset bowel or bladder incontinence. (This applies to Spinal procedures only)  Emergency Numbers:  Durning business hours (Monday - Thursday, 8:00 AM - 4:00 PM) (Friday, 9:00 AM - 12:00 Noon): [phone]  After hours: [phone] ____________________________________________________________________________________________   ____________________________________________________________________________________________  Pain Scale  Introduction: The pain score used by this practice is the Verbal Numerical Rating Scale (VNRS-11). This is an 11-point scale. It is for adults and children 10 years or older. There are significant differences in how the pain score is reported, used, and applied. Forget everything you learned in the past and learn this scoring system.  General Information: The scale should reflect your current level of pain. Unless you are specifically asked for the level of your worst pain, or your average pain. If you are asked for one of these two, then  it should be understood that it is over the past 24 hours.  Basic Activities of Daily Living (ADL): Personal hygiene,  dressing, eating, transferring, and using restroom.  Instructions: Most patients tend to report their level of pain as a combination of two factors, their physical pain and their psychosocial pain. This last one is also known as "suffering" and it is reflection of how physical pain affects you socially and psychologically. From now on, report them separately. From this point on, when asked to report your pain level, report only your physical pain. Use the following table for reference.  Pain Clinic Pain Levels (0-5/10)  Pain Level Score  Description  No Pain 0   Mild pain 1 Nagging, annoying, but does not interfere with basic activities of daily living (ADL). Patients are able to eat, bathe, get dressed, toileting (being able to get on and off the toilet and perform personal hygiene functions), transfer (move in and out of bed or a chair without assistance), and maintain continence (able to control bladder and bowel functions). Blood pressure and heart rate are unaffected. A normal heart rate for a healthy adult ranges from 60 to 100 bpm (beats per minute).   Mild to moderate pain 2 Noticeable and distracting. Impossible to hide from other people. More frequent flare-ups. Still possible to adapt and function close to normal. It can be very annoying and may have occasional stronger flare-ups. With discipline, patients may get used to it and adapt.   Moderate pain 3 Interferes significantly with activities of daily living (ADL). It becomes difficult to feed, bathe, get dressed, get on and off the toilet or to perform personal hygiene functions. Difficult to get in and out of bed or a chair without assistance. Very distracting. With effort, it can be ignored when deeply involved in activities.   Moderately severe pain 4 Impossible to ignore for more than a few minutes. With effort, patients may still be able to manage work or participate in some social activities. Very difficult to concentrate. Signs of  autonomic nervous system discharge are evident: dilated pupils (mydriasis); mild sweating (diaphoresis); sleep interference. Heart rate becomes elevated (>115 bpm). Diastolic blood pressure (lower number) rises above 100 mmHg. Patients find relief in laying down and not moving.   Severe pain 5 Intense and extremely unpleasant. Associated with frowning face and frequent crying. Pain overwhelms the senses.  Ability to do any activity or maintain social relationships becomes significantly limited. Conversation becomes difficult. Pacing back and forth is common, as getting into a comfortable position is nearly impossible. Pain wakes you up from deep sleep. Physical signs will be obvious: pupillary dilation; increased sweating; goosebumps; brisk reflexes; cold, clammy hands and feet; nausea, vomiting or dry heaves; loss of appetite; significant sleep disturbance with inability to fall asleep or to remain asleep. When persistent, significant weight loss is observed due to the complete loss of appetite and sleep deprivation.  Blood pressure and heart rate becomes significantly elevated. Caution: If elevated blood pressure triggers a pounding headache associated with blurred vision, then the patient should immediately seek attention at an urgent or emergency care unit, as these may be signs of an impending stroke.    Emergency Department Pain Levels (6-10/10)  Emergency Room Pain 6 Severely limiting. Requires emergency care and should not be seen or managed at an outpatient pain management facility. Communication becomes difficult and requires great effort. Assistance to reach the emergency department may be required. Facial flushing and profuse sweating along with potentially  increases in heart rate and blood pressure will be evident.   Distressing pain 7 Self-care is very difficult. Assistance is required to transport, or use restroom. Assistance to reach the emergency department will be required. Tasks  requiring coordination, such as bathing and getting dressed become very difficult.   Disabling pain 8 Self-care is no longer possible. At this level, pain is disabling. The individual is unable to do even the most "basic" activities such as walking, eating, bathing, dressing, transferring to a bed, or toileting. Fine motor skills are lost. It is difficult to think clearly.   Incapacitating pain 9 Pain becomes incapacitating. Thought processing is no longer possible. Difficult to remember your own name. Control of movement and coordination are lost.   The worst pain imaginable 10 At this level, most patients pass out from pain. When this level is reached, collapse of the autonomic nervous system occurs, leading to a sudden drop in blood pressure and heart rate. This in turn results in a temporary and dramatic drop in blood flow to the brain, leading to a loss of consciousness. Fainting is one of the body's self defense mechanisms. Passing out puts the brain in a calmed state and causes it to shut down for a while, in order to begin the healing process.    Summary: 1. Refer to this scale when providing us with your pain level. 2. Be accurate and careful when reporting your pain level. This will help with your care. 3. Over-reporting your pain level will lead to loss of credibility. 4. Even a level of 1/10 means that there is pain and will be treated at our facility. 5. High, inaccurate reporting will be documented as "Symptom Exaggeration", leading to loss of credibility and suspicions of possible secondary gains such as obtaining more narcotics, or wanting to appear disabled, for fraudulent reasons. 6. Only pain levels of 5 or below will be seen at our facility. 7. Pain levels of 6 and above will be sent to the Emergency Department and the appointment cancelled. ____________________________________________________________________________________________     

## 2017-11-12 NOTE — Progress Notes (Signed)
Safety precautions to be maintained throughout the outpatient stay will include: orient to surroundings, keep bed in low position, maintain call bell within reach at all times, provide assistance with transfer out of bed and ambulation.  

## 2017-11-13 ENCOUNTER — Telehealth: Payer: Self-pay | Admitting: *Deleted

## 2017-11-13 NOTE — Telephone Encounter (Signed)
Left voicemail with patient to call our office if there is any questions or concerns re; procedure on yesterday.

## 2017-11-25 ENCOUNTER — Ambulatory Visit: Payer: Medicare HMO | Admitting: Pain Medicine

## 2017-11-25 NOTE — Progress Notes (Deleted)
Patient's Name: Leslie Bass  MRN: [id number]  Referring Provider: No ref. provider found  DOB: March 21, 1946  PCP: System, Pcp Not In  DOS: 11/25/2017  Note by: Gaspar Cola, MD  Service setting: Ambulatory outpatient  Specialty: Interventional Pain Management  Location: [facility] (AMB) Pain Management Facility    Patient type: Established   Primary Reason(s) for Visit: Encounter for post-procedure evaluation of chronic illness with mild to moderate exacerbation CC: No chief complaint on file.  HPI  Leslie Bass is a 72 y.o. year old, female patient, who comes today for a post-procedure evaluation. 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 Her primarily concern today is the No chief complaint on file.  Pain Assessment: Location:     Radiating:   Onset:   Duration:   Quality:   Severity:  /10 (self-reported pain score)  Note: Reported level is compatible with observation.                         When using our objective Pain Scale, levels between 6 and 10/10 are said to belong in an emergency room, as it progressively worsens from a 6/10, described as severely limiting, requiring emergency care not usually available at an outpatient pain management facility. At a 6/10 level, communication becomes difficult and requires great effort. Assistance to reach the emergency department may be required. Facial flushing and profuse sweating along with potentially dangerous increases in heart rate and blood pressure will be  evident. Effect on ADL:   Timing:   Modifying factors:    Leslie Bass comes in today for post-procedure evaluation after the treatment done on 11/12/2017.  Further details on both, my assessment(s), as well as the proposed treatment plan, please see below.  Post-Procedure Assessment  11/12/2017 Procedure: Diagnostic left-sided genicular nerve block #1 under fluoroscopic guidance and IV sedation Pre-procedure pain score:  4/10 Post-procedure pain score: 0/10 (100% relief) Influential Factors: BMI:   Intra-procedural challenges: None observed.         Assessment challenges: None detected.              Reported side-effects: None.        Post-procedural adverse reactions or complications: None reported         Sedation: Sedation provided. When no sedatives are used, the analgesic levels obtained are directly associated to the effectiveness of the local anesthetics. However, when sedation is provided, the level of analgesia obtained during the initial 1 hour following the intervention, is believed to be the result of a combination of factors. These factors may include, but are not limited to: 1. The effectiveness of the local anesthetics used. 2. The effects of the analgesic(s) and/or anxiolytic(s) used. 3. The degree of discomfort experienced by the patient at the time of the procedure. 4. The patients ability and reliability in recalling and recording the events. 5. The presence and influence of possible secondary gains and/or psychosocial factors. Reported result: Relief experienced during the 1st hour after the procedure:   (Ultra-Short Term Relief)  Interpretative annotation: Clinically appropriate result. Analgesia during this period is likely to be Local Anesthetic and/or IV Sedative (Analgesic/Anxiolytic) related.          Effects of local anesthetic: The analgesic effects attained during this period are directly associated to the localized infiltration of local anesthetics and  therefore cary significant diagnostic value as to the etiological location, or anatomical origin, of the pain. Expected duration of relief is directly dependent on the pharmacodynamics of the local anesthetic used. Long-acting (4-6 hours) anesthetics used.  Reported result: Relief during the next 4 to 6 hour after the procedure:   (Short-Term Relief)            Interpretative annotation: Clinically appropriate result. Analgesia during this period is likely to be Local Anesthetic-related.          Long-term benefit: Defined as the period of time past the expected duration of local anesthetics (1 hour for short-acting and 4-6 hours for long-acting). With the possible exception of prolonged sympathetic blockade from the local anesthetics, benefits during this period are typically attributed to, or associated with, other factors such as analgesic sensory neuropraxia, antiinflammatory effects, or beneficial biochemical changes provided by agents other than the local anesthetics.  Reported result: Extended relief following procedure:   (Long-Term Relief)            Interpretative annotation: Clinically appropriate result. Good relief. No permanent benefit expected. Inflammation plays a part in the etiology to the pain.          Current benefits: Defined as reported results that persistent at this point in time.   Analgesia: *** %            Function: Somewhat improved ROM: Somewhat improved Interpretative annotation: Recurrence of symptoms. No permanent benefit expected. Effective diagnostic intervention.          Interpretation: Results would suggest a successful diagnostic intervention.                  Plan:  Please see "Plan of Care" for details.                Laboratory Chemistry  Inflammation Markers (CRP: Acute Phase) (ESR: Chronic Phase) Lab Results  Component Value Date   CRP 4.0 10/24/2017   ESRSEDRATE 5 10/24/2017                         Renal Function Markers Lab Results   Component Value Date   BUN 15 10/24/2017   CREATININE 0.72 10/24/2017   GFRAA 99 10/24/2017   GFRNONAA 86 10/24/2017                 Hepatic Function Markers Lab Results  Component Value Date   AST 14 10/24/2017   ALBUMIN 4.4 10/24/2017   ALKPHOS 82 10/24/2017                 Electrolytes Lab Results  Component Value Date   NA 145 (H) 10/24/2017   K 4.4 10/24/2017   CL 103 10/24/2017   CALCIUM 9.6 10/24/2017   MG 1.9 10/24/2017                        Neuropathy Markers Lab Results  Component Value Date   VITAMINB12 1,440 (H) 10/24/2017                 Bone Pathology Markers Lab Results  Component Value Date  25OHVITD1 35 10/24/2017   25OHVITD2 <1.0 10/24/2017   25OHVITD3 35 10/24/2017                         Note: Lab results reviewed.  Recent Diagnostic Imaging Results  DG C-Arm 1-60 Min-No Report Fluoroscopy was utilized by the requesting physician.  No radiographic  interpretation.   Complexity Note: I personally reviewed the fluoroscopic imaging of the procedure.                        Meds   Current Outpatient Medications:  .  acetaminophen (TYLENOL) 500 MG tablet, Take 1,000 mg by mouth every 6 (six) hours as needed for moderate pain or headache., Disp: , Rfl:  .  albuterol (PROVENTIL HFA;VENTOLIN HFA) 108 (90 Base) MCG/ACT inhaler, Inhale 2 puffs into the lungs every 6 (six) hours as needed for wheezing or shortness of breath., Disp: , Rfl:  .  amitriptyline (ELAVIL) 50 MG tablet, Take 50 mg by mouth at bedtime., Disp: , Rfl:  .  amLODipine (NORVASC) 5 MG tablet, Take 5 mg by mouth daily. In evening, Disp: , Rfl:  .  apixaban (ELIQUIS) 2.5 MG TABS tablet, Take 1 tablet (2.5 mg total) by mouth 2 (two) times daily., Disp: , Rfl:  .  ezetimibe (ZETIA) 10 MG tablet, Take 10 mg by mouth daily. In evening, Disp: , Rfl:  .  glimepiride (AMARYL) 2 MG tablet, Take 2 mg by mouth daily., Disp: , Rfl:  .  ibuprofen (ADVIL,MOTRIN) 200 MG tablet, Take 400 mg by  mouth every 6 (six) hours as needed for headache or moderate pain., Disp: , Rfl:  .  Lidocaine HCl (ASPERCREME LIDOCAINE) 4 % LIQD, Apply 1 application topically at bedtime., Disp: , Rfl:  .  metFORMIN (GLUCOPHAGE) 1000 MG tablet, Take 1,000 mg by mouth daily with breakfast. , Disp: , Rfl:  .  omeprazole (PRILOSEC) 40 MG capsule, Take 40 mg by mouth daily., Disp: , Rfl: 3 .  quinapril (ACCUPRIL) 40 MG tablet, Take 40 mg by mouth daily. In evening, Disp: , Rfl: 1  ROS  Constitutional: Denies any fever or chills Gastrointestinal: No reported hemesis, hematochezia, vomiting, or acute GI distress Musculoskeletal: Denies any acute onset joint swelling, redness, loss of ROM, or weakness Neurological: No reported episodes of acute onset apraxia, aphasia, dysarthria, agnosia, amnesia, paralysis, loss of coordination, or loss of consciousness  Allergies  Ms. Kirshner has No Known Allergies.  PFSH  Drug: Ms. Michelini  reports that she does not use drugs. Alcohol:  reports that she does not drink alcohol. Tobacco:  reports that she quit smoking about 21 years ago. She has never used smokeless tobacco. Medical:  has a past medical history of Anxiety, Arthritis, Cancer ([place]), GERD (gastroesophageal reflux disease), Hypertension, Hypothyroidism, Psoriasis, guttate (2016), Pulmonary embolism ([place]) (2016), Sleep apnea, and Thyroid mass (2016). Surgical: Ms. Stagner  has a past surgical history that includes Joint replacement (Left, 10/03/2016); Abdominal hysterectomy (1999); Cesarean section (1977); Mohs surgery (Right, 2013); Tonsillectomy (1961); and Knee arthroscopy (Left, 06/19/2017). Family: family history is not on file.  Constitutional Exam  General appearance: Well nourished, well developed, and well hydrated. In no apparent acute distress There were no vitals filed for this visit. BMI Assessment: Estimated body mass index is 35.43 kg/m as calculated from the following:   Height as of 11/12/17: 5' 3"  (1.6  m).   Weight as of 11/12/17: 200 lb (90.7 kg).  BMI interpretation table: BMI level Category Range association with higher incidence of chronic pain  <18 kg/m2 Underweight   18.5-24.9 kg/m2 Ideal body weight   25-29.9 kg/m2 Overweight Increased incidence by 20%  30-34.9 kg/m2 Obese (Class I) Increased incidence by 68%  35-39.9 kg/m2 Severe obesity (Class II) Increased incidence by 136%  >40 kg/m2 Extreme obesity (Class III) Increased incidence by 254%   BMI Readings from Last 4 Encounters:  11/12/17 35.43 kg/m  10/24/17 35.43 kg/m  06/19/17 35.43 kg/m   Wt Readings from Last 4 Encounters:  11/12/17 200 lb (90.7 kg)  10/24/17 200 lb (90.7 kg)  06/19/17 200 lb (90.7 kg)  Psych/Mental status: Alert, oriented x 3 (person, place, & time)       Eyes: PERLA Respiratory: No evidence of acute respiratory distress  Cervical Spine Area Exam  Skin & Axial Inspection: No masses, redness, edema, swelling, or associated skin lesions Alignment: Symmetrical Functional ROM: Unrestricted ROM      Stability: No instability detected Muscle Tone/Strength: Functionally intact. No obvious neuro-muscular anomalies detected. Sensory (Neurological): Unimpaired Palpation: No palpable anomalies              Upper Extremity (UE) Exam    Side: Right upper extremity  Side: Left upper extremity  Skin & Extremity Inspection: Skin color, temperature, and hair growth are WNL. No peripheral edema or cyanosis. No masses, redness, swelling, asymmetry, or associated skin lesions. No contractures.  Skin & Extremity Inspection: Skin color, temperature, and hair growth are WNL. No peripheral edema or cyanosis. No masses, redness, swelling, asymmetry, or associated skin lesions. No contractures.  Functional ROM: Unrestricted ROM          Functional ROM: Unrestricted ROM          Muscle Tone/Strength: Functionally intact. No obvious neuro-muscular anomalies detected.  Muscle Tone/Strength: Functionally intact. No obvious  neuro-muscular anomalies detected.  Sensory (Neurological): Unimpaired          Sensory (Neurological): Unimpaired          Palpation: No palpable anomalies              Palpation: No palpable anomalies              Specialized Test(s): Deferred         Specialized Test(s): Deferred          Thoracic Spine Area Exam  Skin & Axial Inspection: No masses, redness, or swelling Alignment: Symmetrical Functional ROM: Unrestricted ROM Stability: No instability detected Muscle Tone/Strength: Functionally intact. No obvious neuro-muscular anomalies detected. Sensory (Neurological): Unimpaired Muscle strength & Tone: No palpable anomalies  Lumbar Spine Area Exam  Skin & Axial Inspection: No masses, redness, or swelling Alignment: Symmetrical Functional ROM: Unrestricted ROM      Stability: No instability detected Muscle Tone/Strength: Functionally intact. No obvious neuro-muscular anomalies detected. Sensory (Neurological): Unimpaired Palpation: No palpable anomalies       Provocative Tests: Lumbar Hyperextension and rotation test: evaluation deferred today       Lumbar Lateral bending test: evaluation deferred today       Patrick's Maneuver: evaluation deferred today                    Gait & Posture Assessment  Ambulation: Unassisted Gait: Relatively normal for age and body habitus Posture: WNL   Lower Extremity Exam    Side: Right lower extremity  Side: Left lower extremity  Skin & Extremity Inspection: Skin color, temperature, and hair growth are WNL. No  peripheral edema or cyanosis. No masses, redness, swelling, asymmetry, or associated skin lesions. No contractures.  Skin & Extremity Inspection: Skin color, temperature, and hair growth are WNL. No peripheral edema or cyanosis. No masses, redness, swelling, asymmetry, or associated skin lesions. No contractures.  Functional ROM: Unrestricted ROM          Functional ROM: Unrestricted ROM          Muscle Tone/Strength: Functionally  intact. No obvious neuro-muscular anomalies detected.  Muscle Tone/Strength: Functionally intact. No obvious neuro-muscular anomalies detected.  Sensory (Neurological): Unimpaired  Sensory (Neurological): Unimpaired  Palpation: No palpable anomalies  Palpation: No palpable anomalies   Assessment  Primary Diagnosis & Pertinent Problem List: The primary encounter diagnosis was Chronic knee pain (Primary Area of Pain) (Left). Diagnoses of Chronic knee pain after total replacement (Left), Chronic lower extremity pain (Secondary Area of Pain) (Left), and Pain due to internal orthopedic knee prosthetic devices, implants and grafts, sequela (Left) were also pertinent to this visit.  Status Diagnosis  Controlled Controlled Controlled 1. Chronic knee pain (Primary Area of Pain) (Left)   2. Chronic knee pain after total replacement (Left)   3. Chronic lower extremity pain (Secondary Area of Pain) (Left)   4. Pain due to internal orthopedic knee prosthetic devices, implants and grafts, sequela (Left)     Problems updated and reviewed during this visit: No problems updated. Plan of Care  Pharmacotherapy (Medications Ordered): No orders of the defined types were placed in this encounter.  Medications administered today: Tieara B. Caton had no medications administered during this visit.  Procedure Orders    No procedure(s) ordered today   Lab Orders  No laboratory test(s) ordered today   Imaging Orders  No imaging studies ordered today   Referral Orders  No referral(s) requested today    Interventional management options: Planned, scheduled, and/or pending:   ***   Considering:   Diagnostic left genicular nerve block #2  Possible left genicular nerve RFA    Palliative PRN treatment(s):   None at this time   Provider-requested follow-up: No follow-ups on file.  Future Appointments  Date Time Provider Tyhee  11/25/2017  1:15 PM Milinda Pointer, MD [facility] None    Primary Care Physician: System, Pcp Not In Location: [facility] Note by: Gaspar Cola, MD Date: 11/25/2017; Time: 7:59 AM

## 2017-12-18 ENCOUNTER — Ambulatory Visit: Payer: Medicare HMO | Admitting: Pain Medicine

## 2019-09-24 ENCOUNTER — Ambulatory Visit: Payer: Medicare HMO | Attending: Internal Medicine

## 2019-09-24 DIAGNOSIS — Z23 Encounter for immunization: Secondary | ICD-10-CM | POA: Insufficient documentation

## 2019-09-28 NOTE — Progress Notes (Signed)
   Covid-19 Vaccination Clinic  Name:  Leslie Bass    MRN: [id number] DOB: 1946-07-04  09/24/2019  [name] was observed post Covid-19 immunization for 15 minutes without incidence. She was provided with Vaccine Information Sheet and instruction to access the V-Safe system.   [name] was instructed to call 911 with any severe reactions post vaccine: Marland Kitchen Difficulty breathing  . Swelling of your face and throat  . A fast heartbeat  . A bad rash all over your body  . Dizziness and weakness    Immunizations Administered    Name Date Dose VIS Date Route   Moderna COVID-19 Vaccine 09/24/2019  6:18 PM 0.5 mL 08/04/2019 Intramuscular   Manufacturer: Moderna   Lot: [id number]   [id number]

## 2019-10-22 ENCOUNTER — Ambulatory Visit: Payer: Medicare HMO

## 2019-10-27 ENCOUNTER — Ambulatory Visit: Payer: Medicare HMO | Attending: Internal Medicine

## 2019-10-27 DIAGNOSIS — Z23 Encounter for immunization: Secondary | ICD-10-CM | POA: Insufficient documentation

## 2019-10-27 NOTE — Progress Notes (Signed)
   Covid-19 Vaccination Clinic  Name:  Leslie Bass    MRN: [id number] DOB: 06/22/1946  10/27/2019  [name] was observed post Covid-19 immunization for 15 minutes without incidence. She was provided with Vaccine Information Sheet and instruction to access the V-Safe system.   [name] was instructed to call 911 with any severe reactions post vaccine: Marland Kitchen Difficulty breathing  . Swelling of your face and throat  . A fast heartbeat  . A bad rash all over your body  . Dizziness and weakness    Immunizations Administered    Name Date Dose VIS Date Route   Moderna COVID-19 Vaccine 10/27/2019  5:13 PM 0.5 mL 08/04/2019 Intramuscular   Manufacturer: Moderna   Lot: [id number]   [id number]
